# Patient Record
Sex: Male | Born: 1950
Health system: Southern US, Community
[De-identification: ages and names within clinical notes are randomized; demographics above are authoritative.]

## PROBLEM LIST (undated history)

## (undated) DIAGNOSIS — R399 Unspecified symptoms and signs involving the genitourinary system: Secondary | ICD-10-CM

## (undated) DIAGNOSIS — H269 Unspecified cataract: Secondary | ICD-10-CM

## (undated) DIAGNOSIS — G4730 Sleep apnea, unspecified: Secondary | ICD-10-CM

## (undated) DIAGNOSIS — Z860100 Personal history of colon polyps, unspecified: Secondary | ICD-10-CM

## (undated) DIAGNOSIS — Z9989 Dependence on other enabling machines and devices: Secondary | ICD-10-CM

## (undated) DIAGNOSIS — N529 Male erectile dysfunction, unspecified: Secondary | ICD-10-CM

## (undated) DIAGNOSIS — G250 Essential tremor: Secondary | ICD-10-CM

## (undated) DIAGNOSIS — M5030 Other cervical disc degeneration, unspecified cervical region: Secondary | ICD-10-CM

## (undated) DIAGNOSIS — G4733 Obstructive sleep apnea (adult) (pediatric): Secondary | ICD-10-CM

## (undated) DIAGNOSIS — E785 Hyperlipidemia, unspecified: Secondary | ICD-10-CM

## (undated) DIAGNOSIS — Z87442 Personal history of urinary calculi: Secondary | ICD-10-CM

## (undated) DIAGNOSIS — K219 Gastro-esophageal reflux disease without esophagitis: Secondary | ICD-10-CM

## (undated) DIAGNOSIS — N189 Chronic kidney disease, unspecified: Secondary | ICD-10-CM

## (undated) DIAGNOSIS — N400 Enlarged prostate without lower urinary tract symptoms: Secondary | ICD-10-CM

## (undated) DIAGNOSIS — E119 Type 2 diabetes mellitus without complications: Secondary | ICD-10-CM

## (undated) DIAGNOSIS — R7301 Impaired fasting glucose: Secondary | ICD-10-CM

## (undated) DIAGNOSIS — I10 Essential (primary) hypertension: Secondary | ICD-10-CM

## (undated) DIAGNOSIS — Z973 Presence of spectacles and contact lenses: Secondary | ICD-10-CM

## (undated) HISTORY — DX: Unspecified cataract: H26.9

## (undated) HISTORY — PX: POLYPECTOMY: SHX149

## (undated) HISTORY — DX: Essential (primary) hypertension: I10

## (undated) HISTORY — DX: Hyperlipidemia, unspecified: E78.5

## (undated) HISTORY — DX: Benign prostatic hyperplasia without lower urinary tract symptoms: N40.0

## (undated) HISTORY — PX: HAND SURGERY: SHX662

## (undated) HISTORY — DX: Male erectile dysfunction, unspecified: N52.9

## (undated) HISTORY — DX: Impaired fasting glucose: R73.01

## (undated) HISTORY — DX: Gastro-esophageal reflux disease without esophagitis: K21.9

## (undated) HISTORY — PX: COLONOSCOPY: SHX174

## (undated) HISTORY — DX: Sleep apnea, unspecified: G47.30

## (undated) HISTORY — DX: Chronic kidney disease, unspecified: N18.9

## (undated) HISTORY — DX: Type 2 diabetes mellitus without complications: E11.9

## (undated) HISTORY — PX: CARDIOVASCULAR STRESS TEST: SHX262

---

## 1998-03-16 HISTORY — PX: CERVICAL DISCECTOMY: SHX98

## 1999-03-17 HISTORY — PX: CERVICAL FUSION: SHX112

## 2001-11-01 ENCOUNTER — Encounter: Payer: Self-pay | Admitting: Neurological Surgery

## 2001-11-01 ENCOUNTER — Ambulatory Visit (HOSPITAL_COMMUNITY): Admission: RE | Admit: 2001-11-01 | Discharge: 2001-11-02 | Payer: Self-pay | Admitting: Neurological Surgery

## 2001-11-01 HISTORY — PX: ANTERIOR CERVICAL DECOMP/DISCECTOMY FUSION: SHX1161

## 2004-03-16 LAB — HM COLONOSCOPY

## 2005-04-26 ENCOUNTER — Encounter: Admission: RE | Admit: 2005-04-26 | Discharge: 2005-04-26 | Payer: Self-pay | Admitting: Family Medicine

## 2005-10-15 ENCOUNTER — Encounter: Admission: RE | Admit: 2005-10-15 | Discharge: 2005-10-15 | Payer: Self-pay | Admitting: Specialist

## 2006-02-03 ENCOUNTER — Encounter (INDEPENDENT_AMBULATORY_CARE_PROVIDER_SITE_OTHER): Payer: Self-pay | Admitting: Specialist

## 2006-02-03 ENCOUNTER — Ambulatory Visit (HOSPITAL_COMMUNITY): Admission: RE | Admit: 2006-02-03 | Discharge: 2006-02-03 | Payer: Self-pay | Admitting: *Deleted

## 2007-03-01 ENCOUNTER — Ambulatory Visit (HOSPITAL_COMMUNITY): Admission: RE | Admit: 2007-03-01 | Discharge: 2007-03-01 | Payer: Self-pay | Admitting: *Deleted

## 2008-10-17 ENCOUNTER — Encounter: Admission: RE | Admit: 2008-10-17 | Discharge: 2008-10-17 | Payer: Self-pay | Admitting: Internal Medicine

## 2010-03-16 HISTORY — PX: KNEE ARTHROSCOPY: SUR90

## 2010-07-29 NOTE — Op Note (Signed)
[name], Tyler Wiggins               ACCOUNT NO.:  [id number]   MEDICAL RECORD NO.:  [id number]          PATIENT TYPE:  AMB   LOCATION:  ENDO                         FACILITY:  [facility]   PHYSICIAN:  Georgiana Spinner, M.D.    DATE OF BIRTH:  [birth year]   DATE OF PROCEDURE:  03/01/2007  DATE OF DISCHARGE:                               OPERATIVE REPORT   PROCEDURE:  Upper endoscopy.   INDICATIONS:  Gastroesophageal reflux disease.   ANESTHESIA:  Fentanyl 50 mcg, Versed 5 mg.   PROCEDURE:  With the patient mildly sedated in the left lateral  decubitus position the Pentax videoscopic endoscope was inserted in the  mouth passed under direct vision through the esophagus which appeared  normal and after close inspection there appeared to be no evidence of  Barrett's esophagus.  We entered into the stomach.  The fundus, body,  antrum, duodenal bulb, second portion duodenum were visualized.  From  this point the endoscope was slowly withdrawn taking circumferential  views of duodenal mucosa until the endoscope had been pulled back into  stomach placed in retroflexion to view the stomach from below.  The  endoscope was then straightened and withdrawn taking circumferential  views remaining gastric and esophageal mucosa.  The patient's vital  signs, pulse oximeter remained stable.  The patient tolerated procedure  well without apparent complications.   FINDINGS:  Negative examination without any evidence of Barrett's  esophagus.   PLAN:  Proceed to colonoscopy.           ______________________________  Georgiana Spinner, M.D.     GMO/MEDQ  D:  03/01/2007  T:  03/01/2007  Job:  [id number]

## 2010-07-29 NOTE — Op Note (Signed)
NAMEUSBALDO, Tyler Wiggins               ACCOUNT NO.:  [id number]   MEDICAL RECORD NO.:  [id number]          PATIENT TYPE:  AMB   LOCATION:  ENDO                         FACILITY:  [facility]   PHYSICIAN:  Georgiana Spinner, M.D.    DATE OF BIRTH:  16-Oct-1950   DATE OF PROCEDURE:  03/01/2007  DATE OF DISCHARGE:                               OPERATIVE REPORT   PROCEDURE:  Colonoscopy.   INDICATIONS:  Colon polyp with large polyp removed and eradicated last  year.   ANESTHESIA:  Fentanyl 70 mcg; Versed 7 mg.   PROCEDURE:  With the patient mildly sedated in the left lateral  decubitus position, a rectal examination was attempted.  Subsequently  the Pentax videoscopic colonoscope was inserted in the rectum and passed  under direct vision with pressure applied to reach the cecum, identified  by ileocecal valve and appendiceal orifice, both of which were  photographed.  From this point the colonoscope was slowly withdrawn,  taking circumferential views of the colonic mucosa, carefully inspecting  as we went, and pulling back all the way to the rectum which appeared  normal on direct and showed hemorrhoids on retroflexed view.  The  endoscope was straightened and withdrawn.  The patient's vital signs and  pulse oximeter remained stable.  The patient tolerated the procedure  well without apparent complications.   FINDINGS:  Internal hemorrhoids, otherwise unremarkable examination.   PLAN:  Repeat examination in 5 years.           ______________________________  Georgiana Spinner, M.D.     GMO/MEDQ  D:  03/01/2007  T:  03/01/2007  Job:  [id number]

## 2010-08-01 NOTE — Op Note (Signed)
NAME:  Tyler Wiggins, Tyler Wiggins                         ACCOUNT NO.:  [id number]   MEDICAL RECORD NO.:  [id number]                   PATIENT TYPE:  OIB   LOCATION:  3172                                 FACILITY:  [facility]   PHYSICIAN:  Stefani Dama, M.D.               DATE OF BIRTH:  1950/05/20   DATE OF PROCEDURE:  11/01/2001  DATE OF DISCHARGE:                                 OPERATIVE REPORT   PREOPERATIVE DIAGNOSIS:  C5-6 herniated nucleus pulposus with cervical  radiculopathy.   POSTOPERATIVE DIAGNOSIS:  C5-6 herniated nucleus pulposus with cervical  radiculopathy.   PROCEDURE:  Anterior cervical diskectomy and arthrodesis with structural  allograft, Synthes plate fixation, C5-6.   SURGEON:  Stefani Dama, M.D.   ASSISTANT:  Hewitt Shorts, M.D.   ANESTHESIA:  General endotracheal.   INDICATIONS:  The patient is a 60 year old individual who has had  significant neck, shoulder, and arm pain, particularly in the left arm.  He  has failed conservative efforts at management and has a broad-based disk  herniation at the C5-6 level with biforaminal stenosis.  He has previously  had an anterior diskectomy at C6-7 some 10 years ago, which has not caused  him any difficulties.   DESCRIPTION OF PROCEDURE:  The patient was brought to the operating room and  placed on the table in supine position.  After the smooth induction of  general endotracheal anesthesia, he was placed in five pounds of Holter  traction.  The neck was shaved, prepped with Duraprep, and draped in a  sterile fashion.  A transverse incision on the left side of the neck was  brought down through the platysma, and the plane between the  sternocleidomastoid and strap muscles was dissected bluntly until the  prevertebral space was reached.  The first identifiable disk space was noted  to be that of C5-6, and then after clearing the longus colli muscle on  either side of the midline, a Caspar retractor was placed in  the wound.  Diskectomy was performed by opening the ventral aspect of the disk space  with a 15 blade, and then a combination of curettes and rongeurs was used to  evacuate the disk space.  A high-speed bur and a 2.3 mm dissecting tool were  used to take down the osteophytes and the uncinate spurs that were forming  in either side.  There was found to be subligamentous disk material  herniated in the foramen on each side at the level of the uncinate  processes.  Each side was decompressed and ultimately the posterior  longitudinal ligament was opened completely to expose the common dural tube  and the takeoff of the C6 nerve roots.  Once this was accomplished,  hemostasis in the soft tissues was obtained, and an 8 mm tricortical graft  was placed into the interspace with the tricortical surface facing dorsally  and the ventral aspect superior.  A 19 mm standard-size Synthes plate was  affixed to the ventral aspects of the vertebral bodies with four locking 4 x  14 mm screws.  Hemostasis was again checked, and the construct was checked  radiographically.  The platysma was then closed with 3-0 Vicryl in  interrupted fashion, and 3-0 Vicryl was used to close the subcuticular  tissues.  The patient tolerated the procedure well and was returned to the  recovery room in stable condition.                                               Stefani Dama, M.D.   Merla Riches  D:  11/01/2001  T:  11/03/2001  Job:  [id number]

## 2010-08-01 NOTE — Op Note (Signed)
Tyler Wiggins, Tyler Wiggins               ACCOUNT NO.:  [id number]   MEDICAL RECORD NO.:  [id number]          PATIENT TYPE:  AMB   LOCATION:  ENDO                         FACILITY:  [facility]   PHYSICIAN:  Georgiana Spinner, M.D.    DATE OF BIRTH:  May 01, 1950   DATE OF PROCEDURE:  02/03/2006  DATE OF DISCHARGE:                                 OPERATIVE REPORT   PROCEDURE:  Colonoscopy.   INDICATIONS:  Colon polyp.   ANESTHESIA:  Fentanyl 25 mcg, Versed 2 mg.   PROCEDURE:  With the patient mildly sedated in the left lateral decubitus  position, a rectal exam was performed which was unremarkable.  Subsequently  the Olympus videoscopic colonoscope was inserted in the rectum and passed  under direct vision to the cecum, identified by ileocecal valve and  appendiceal orifice.  From this point the colonoscope was slowly withdrawn  taking circumferential views of the colonic mucosa, stopping only in the  ascending colon just a few folds removed from the ileocecal valve, at which  point we encountered a polyp that we photographed first.  Using hot biopsy  forceps technique, we removed a portion of the polyp.  We then next used  snare cautery technique and removed another portion of the polyp, again with  a setting of 20/200 blended current.  it was clear that there was more polyp  behind the folds, so I placed the colonoscope into a retroflexed view from  the cecum and was able to view the polyp in its entirety on the backside of  the fold and therefore, using snare cautery technique and once again a  setting of 20/200 blended current, I was able to remove the entire polyp.  It was three-headed, this was photographed and, as stated, subsequently  removed.  However, when we did this the polyp jumped off the snare and we  could not retrieve it although we looked rather exhaustively throughout the  ascending colon and cecum both in direct and retroflexed view, and then I  elected to withdraw the colonoscope  from that point, taking circumferential  views of the remaining colonic mucosa, stopping then only in the rectum,  which appeared normal on direct and showed hemorrhoids on retroflexed view,  the endoscope was straightened and withdrawn.  The patient's vital signs and  pulse oximeter remained stable.  The patient tolerated the procedure well  without apparent complications.   FINDINGS:  1. Internal hemorrhoids.  2. Polyp of the ascending colon, removed.  Await biopsy report.  We have      two portions of the polyp, but a majority of it was lost.   PLAN:  Await biopsy report.  The patient will call me for results and follow  up with me as an outpatient.           ______________________________  Georgiana Spinner, M.D.     GMO/MEDQ  D:  02/03/2006  T:  02/03/2006  Job:  [id number]

## 2010-08-01 NOTE — Op Note (Signed)
Tyler Wiggins, Tyler Wiggins NO.:  [id number]   MEDICAL RECORD NO.:  [id number]          PATIENT TYPE:  AMB   LOCATION:  ENDO                         FACILITY:  [facility]   PHYSICIAN:  Georgiana Spinner, M.D.    DATE OF BIRTH:  08-28-1950   DATE OF PROCEDURE:  02/03/2006  DATE OF DISCHARGE:                                 OPERATIVE REPORT   SURGEON:  Georgiana Spinner, M.D.   PROCEDURE:  Upper endoscopy.   INDICATIONS:  GERD.   ANESTHESIA:  Fentanyl 50 mcg, Versed 5 mg.   PROCEDURE:  With the patient mildly sedated in the left lateral decubitus  position, the Olympus videoscopic endoscope was inserted in the mouth and  passed under direct vision through the esophagus, which appeared normal,  into the stomach.  The fundus, body, antrum, duodenal bulb and second  portion of the duodenum appeared normal.  From this point, the endoscope was  slowly withdrawn, taking circumferential views of the duodenal mucosa, until  the endoscope was then pulled back into the stomach and placed in  retroflexion to view the stomach from below.  The endoscope was then  straightened and withdrawn, taking circumferential views of the remaining  gastric and esophageal mucosa.  The patient's vital signs and pulse oximetry  remained stable.  The patient tolerated the procedure well without apparent  complications.   FINDINGS:  Rather unremarkable examination.   PLAN:  Proceed to colonoscopy.           ______________________________  Georgiana Spinner, M.D.     GMO/MEDQ  D:  02/03/2006  T:  02/04/2006  Job:  [id number]

## 2010-08-26 ENCOUNTER — Telehealth: Payer: Self-pay | Admitting: Internal Medicine

## 2010-08-26 NOTE — Telephone Encounter (Addendum)
ROI faxed over to [facility] @ [phone] to Obtain records from Dr.Tysinger  08/26/10/km  Records received from [facility] they were faxed to Schedulers fax,verified with  [facility] records received  09/09/10/km

## 2010-09-12 ENCOUNTER — Encounter: Payer: Self-pay | Admitting: Internal Medicine

## 2010-09-15 ENCOUNTER — Ambulatory Visit (INDEPENDENT_AMBULATORY_CARE_PROVIDER_SITE_OTHER): Payer: 59 | Admitting: Internal Medicine

## 2010-09-15 ENCOUNTER — Encounter: Payer: Self-pay | Admitting: Internal Medicine

## 2010-09-15 ENCOUNTER — Telehealth: Payer: Self-pay | Admitting: *Deleted

## 2010-09-15 VITALS — BP 130/84 | HR 88 | Ht 72.0 in | Wt 272.0 lb

## 2010-09-15 DIAGNOSIS — E669 Obesity, unspecified: Secondary | ICD-10-CM | POA: Insufficient documentation

## 2010-09-15 DIAGNOSIS — E785 Hyperlipidemia, unspecified: Secondary | ICD-10-CM

## 2010-09-15 DIAGNOSIS — I10 Essential (primary) hypertension: Secondary | ICD-10-CM | POA: Insufficient documentation

## 2010-09-15 DIAGNOSIS — R9431 Abnormal electrocardiogram [ECG] [EKG]: Secondary | ICD-10-CM | POA: Insufficient documentation

## 2010-09-15 NOTE — Assessment & Plan Note (Signed)
Patient's EKG is without significant abnormaliites. Current findings are nonspecific. Different from previous probably because he is 70# heavier. I think he is at low risk for a major cardiac event   I have placed no work/activity restrictions.  He is free to exercise vigorously.

## 2010-09-15 NOTE — Assessment & Plan Note (Signed)
Will try to get last fasting lipids.

## 2010-09-15 NOTE — Progress Notes (Signed)
HPI  Patient is a 60 year old with no known CAD.  He presents as a prejob physical The patient was seen by Richarda Blade in the past.  He had an EKG done that was read out as abnormal.   He underwent a stress myoview in December 2010.  He exdercised for 11 minutes.  EKIG was without ischemia   Myoview showed normal perfuison. Since then he denies chest pains.  He is very active.  Works in the yard.  Mows the lawn.  Plays raquetball.  No problems. He is trying to lose wt.   No Known Allergies  Current Outpatient Prescriptions  Medication Sig Dispense Refill  . aspirin 325 MG tablet Take 325 mg by mouth daily.        . CYCLOBENZAPRINE HCL PO Take by mouth.        Marland Kitchen lisinopril-hydrochlorothiazide (PRINZIDE,ZESTORETIC) 10-12.5 MG per tablet Take 1 tablet by mouth daily.        . rosuvastatin (CRESTOR) 10 MG tablet Take 10 mg by mouth daily.        . Tamsulosin HCl (FLOMAX) 0.4 MG CAPS Take by mouth.        . DISCONTD: LISINOPRIL PO Take by mouth.        . DISCONTD: Rosuvastatin Calcium (CRESTOR PO) Take by mouth.         Past Medical History  Diagnosis Date  . HTN (hypertension)     history of  . HLD (hyperlipidemia)     history of    No past surgical history on file.  Family History  Problem Relation Age of Onset  . Heart defect Father     cabg    History   Social History  . Marital Status: Married    Spouse Name: N/A    Number of Children: N/A  . Years of Education: N/A   Occupational History  . Not on file.   Social History Main Topics  . Smoking status: Former Smoker    Quit date: 03/16/1993  . Smokeless tobacco: Not on file  . Alcohol Use: Not on file  . Drug Use: Not on file  . Sexually Active: Not on file   Other Topics Concern  . Not on file   Social History Narrative  . No narrative on file    Review of Systems:  All systems reviewed.  They are negative to the above problem except as previously stated.  Vital Signs: BP 130/84  Pulse 88  Ht 6'  (1.829 m)  Wt 272 lb (123.378 kg)  BMI 36.89 kg/m2  Physical Exam  Patient is in NAD.  HEENT:  Normocephalic, atraumatic. EOMI, PERRLA.  Neck: JVP is normal. No thyromegaly. No bruits.  Lungs: clear to auscultation. No rales no wheezes.  Heart: Regular rate and rhythm. Normal S1, S2. No S3.   No significant murmurs. PMI not displaced.  Abdomen:  Supple, nontender. Normal bowel sounds. No masses. No hepatomegaly.  Extremities:   Good distal pulses throughout. No lower extremity edema.  Musculoskeletal :moving all extremities.  Neuro:   alert and oriented x3.  CN II-XII grossly intact.  EKG:  NSR.  88 bpm.  Q  Wave in III.  (since previous EKGs QRS voltages are less prominent  (patient is 70# heavier))  Assessment and Plan:

## 2010-09-15 NOTE — Telephone Encounter (Signed)
LMOM to find out where he wants app. Info and EKG faxed to.

## 2010-09-15 NOTE — Assessment & Plan Note (Signed)
Adquate control.

## 2010-09-15 NOTE — Assessment & Plan Note (Signed)
Encouraged him to try [employer].

## 2010-09-16 NOTE — Telephone Encounter (Signed)
Called patient back. He has no fax # to send information to, so he will pick up a copy of the office note and the EKG from yesterday. Copies left at the front desk.

## 2010-09-16 NOTE — Telephone Encounter (Signed)
Returning call back to [place].

## 2010-09-18 ENCOUNTER — Encounter: Payer: Self-pay | Admitting: Internal Medicine

## 2010-09-26 ENCOUNTER — Telehealth: Payer: Self-pay | Admitting: Internal Medicine

## 2010-09-26 NOTE — Telephone Encounter (Signed)
LOV,12 lead faxed to [facility] @ [phone]  09/26/10/km

## 2011-04-23 ENCOUNTER — Other Ambulatory Visit: Payer: Self-pay | Admitting: Family Medicine

## 2011-04-23 LAB — COMPREHENSIVE METABOLIC PANEL
ALT: 35 U/L (ref 0–53)
AST: 26 U/L (ref 0–37)
Albumin: 4.5 g/dL (ref 3.5–5.2)
Alkaline Phosphatase: 40 U/L (ref 39–117)
BUN: 30 mg/dL — ABNORMAL HIGH (ref 6–23)
Potassium: 4.8 mEq/L (ref 3.5–5.3)
Sodium: 134 mEq/L — ABNORMAL LOW (ref 135–145)
Total Protein: 7.2 g/dL (ref 6.0–8.3)

## 2011-04-23 LAB — LIPID PANEL
HDL: 27 mg/dL — ABNORMAL LOW (ref >39)
LDL Cholesterol: 52 mg/dL (ref 0–99)

## 2011-04-28 NOTE — Progress Notes (Signed)
Quick Note:  You labs are abnormal. Please call pt And notify him that his labs indicate that he is probably a little dehydrated and his kidney function numbers confirm this. Encourage adequate water intake To help his kidney function; of course, this is also a result of long-standing HTN. His lipid numbers are abnormal; he should continue his current meds, improve his nutrition. Provide him with a copy   of his labs And advise that we recheck them in about 3 months.  Thank you.  ______

## 2011-04-29 ENCOUNTER — Encounter: Payer: Self-pay | Admitting: Family Medicine

## 2011-09-11 ENCOUNTER — Ambulatory Visit (INDEPENDENT_AMBULATORY_CARE_PROVIDER_SITE_OTHER): Payer: 59 | Admitting: Family Medicine

## 2011-09-11 ENCOUNTER — Ambulatory Visit: Payer: 59

## 2011-09-11 VITALS — BP 144/86 | HR 56 | Temp 98.6°F | Resp 14 | Ht 71.0 in | Wt 274.0 lb

## 2011-09-11 DIAGNOSIS — M79673 Pain in unspecified foot: Secondary | ICD-10-CM

## 2011-09-11 DIAGNOSIS — H6500 Acute serous otitis media, unspecified ear: Secondary | ICD-10-CM

## 2011-09-11 DIAGNOSIS — H6990 Unspecified Eustachian tube disorder, unspecified ear: Secondary | ICD-10-CM

## 2011-09-11 DIAGNOSIS — M79609 Pain in unspecified limb: Secondary | ICD-10-CM

## 2011-09-11 DIAGNOSIS — H6980 Other specified disorders of Eustachian tube, unspecified ear: Secondary | ICD-10-CM

## 2011-09-11 DIAGNOSIS — H9190 Unspecified hearing loss, unspecified ear: Secondary | ICD-10-CM

## 2011-09-11 MED ORDER — MELOXICAM 15 MG PO TABS: 15.0000 mg | ORAL_TABLET | Freq: Every day | ORAL | Status: DC

## 2011-09-11 MED ORDER — IPRATROPIUM BROMIDE 0.03 % NA SOLN: 2.0000 | Freq: Two times a day (BID) | NASAL | Status: DC

## 2011-09-11 NOTE — Progress Notes (Signed)
  Subjective:    Patient ID: Tyler Wiggins, male    DOB: Jul 27, 1950, 61 y.o.   MRN: [id number]  HPI  Presents with several weeks of foot pain.  Began after started wearing Dr. Margart Sickles inserts (a type that you make following kit instructions).  Worse on the right.  When he discontinued them, the pain on the left nearly completely resolved, but persists on the right.  Has noticed some swelling in the area of discomfort, along the arch and medially.  Additionally, he notes several days of sudden hearing loss in the right ear.  This is different from the chronic loss of hearing higher frequencies that he's had for some time.  No pain or drainage.  Intermittent nasal congestion, runny nose and post-nasal drainage.  No recent respiratory illness.  Review of Systems As above.    Objective:   Physical Exam Vital signs noted. Well-developed, well nourished WM who is awake, alert and oriented, in NAD. HEENT: [place]/AT, sclera and conjunctiva are clear.  EAC are patent, left TM is normal in appearance. The right TM is mildly injected and bulging, but translucent. Nasal mucosa is pink and moist. OP is clear. Neck: supple, non-tender, no lymphadenopathy, thyromegaly. Heart: RRR, no murmur Lungs: CTA Extremities: no cyanosis, clubbing.  Minimal swelling noted of the right foot medially along th arch.  Modestly tender, and mild pain with ROM. Skin: warm and dry without rash.  [facility] reading (PRIMARY) by  Dr. Milus Glazier.  Normal foot.     Assessment & Plan:   1. Foot pain  DG Foot Complete Right, meloxicam (MOBIC) 15 MG tablet  2. Hearing loss, acute on chronic    3. ETD (eustachian tube dysfunction)  ipratropium (ATROVENT) 0.03 % nasal spray  4. Otitis media, acute serous  ipratropium (ATROVENT) 0.03 % nasal spray   RTC if symptoms worsen or persist. Discussed with Dr. Milus Glazier.

## 2011-11-06 ENCOUNTER — Ambulatory Visit: Payer: 59

## 2011-11-06 ENCOUNTER — Ambulatory Visit (INDEPENDENT_AMBULATORY_CARE_PROVIDER_SITE_OTHER): Payer: 59 | Admitting: Emergency Medicine

## 2011-11-06 VITALS — BP 108/64 | HR 62 | Temp 98.5°F | Resp 18 | Ht 71.0 in | Wt 257.0 lb

## 2011-11-06 DIAGNOSIS — IMO0002 Reserved for concepts with insufficient information to code with codable children: Secondary | ICD-10-CM

## 2011-11-06 DIAGNOSIS — M25476 Effusion, unspecified foot: Secondary | ICD-10-CM

## 2011-11-06 DIAGNOSIS — M109 Gout, unspecified: Secondary | ICD-10-CM

## 2011-11-06 MED ORDER — INDOMETHACIN 50 MG PO CAPS: 50.0000 mg | ORAL_CAPSULE | Freq: Two times a day (BID) | ORAL | Status: DC

## 2011-11-06 MED ORDER — COLCHICINE 0.6 MG PO TABS: 0.6000 mg | ORAL_TABLET | Freq: Every day | ORAL | Status: DC

## 2011-11-06 MED ORDER — INDOMETHACIN 50 MG PO CAPS: 50.0000 mg | ORAL_CAPSULE | Freq: Two times a day (BID) | ORAL | Status: AC

## 2011-11-06 NOTE — Progress Notes (Signed)
Date:  11/06/2011   Name:  Tyler Wiggins   DOB:  Jul 19, 1950   MRN:  [id number] Gender: male  Age: 61 y.o.  PCP:  Birdena Jubilee, MD    Chief Complaint: Joint Swelling   History of Present Illness:  Tyler Wiggins is a 61 y.o. pleasant patient who presents with the following:  Has been walking on a treadmill attempting to lose weight and has developed pain in lateral ankle and foot that is progressive and worsening over the past three days. He awakened in the morning and had pain.  No recollection of injury.  No history of gout.  Prior effusion of knee and was aspirated with no diagnosis per patient.  Patient Active Problem List  Diagnosis  . Abnormal EKG  . Dyslipidemia  . Hypertension  . Obesity    Past Medical History  Diagnosis Date  . HTN (hypertension)     history of  . HLD (hyperlipidemia)     history of    Past Surgical History  Procedure Date  . Spine surgery     c-spine fusion  . Hand surgery     History  Substance Use Topics  . Smoking status: Former Smoker    Quit date: 03/16/1993  . Smokeless tobacco: Not on file  . Alcohol Use: Not on file    Family History  Problem Relation Age of Onset  . Heart defect Father     cabg    Allergies  Allergen Reactions  . Valium (Diazepam)     disorientation    Medication list has been reviewed and updated.  Current Outpatient Prescriptions on File Prior to Visit  Medication Sig Dispense Refill  . aspirin 325 MG tablet Take 325 mg by mouth daily.        . CYCLOBENZAPRINE HCL PO Take 10 mg by mouth at bedtime.       Marland Kitchen lisinopril-hydrochlorothiazide (PRINZIDE,ZESTORETIC) 10-12.5 MG per tablet Take 1 tablet by mouth daily.        . rosuvastatin (CRESTOR) 10 MG tablet Take 10 mg by mouth daily.        . Tamsulosin HCl (FLOMAX) 0.4 MG CAPS Take 0.8 mg by mouth.         Review of Systems:  As per HPI, otherwise negative.    Physical Examination: Filed Vitals:   11/06/11 1634  BP: 108/64  Pulse: 62    Temp: 98.5 F (36.9 C)  Resp: 18   Filed Vitals:   11/06/11 1634  Height: 5\' 11"  (1.803 m)  Weight: 257 lb (116.574 kg)   Body mass index is 35.84 kg/(m^2). Ideal Body Weight: Weight in (lb) to have BMI = 25: 178.9    GEN: WDWN, NAD, Non-toxic, Alert & Oriented x 3 HEENT: Atraumatic, Normocephalic.  Ears and Nose: No external deformity. EXTR: No clubbing/cyanosis/edema NEURO: Normal gait.  PSYCH: Normally interactive. Conversant. Not depressed or anxious appearing.  Calm demeanor.  Right ankle effusion and tenderness lateral ankle and over base of 5th MC.  No deformity, crepitus, or limitation of motion.  No ecchymosis.  NATI  Assessment and Plan: [facility] reading (PRIMARY) by  Dr. Dareen Piano.  Negative  Gout Indocin colcrys Follow up as needed.    Carmelina Dane, MD

## 2011-11-06 NOTE — Addendum Note (Signed)
Addended by: Carmelina Dane on: 11/06/2011 06:09 PM   Modules accepted: Orders

## 2012-01-25 ENCOUNTER — Ambulatory Visit (INDEPENDENT_AMBULATORY_CARE_PROVIDER_SITE_OTHER): Payer: 59 | Admitting: Neurology

## 2012-01-25 ENCOUNTER — Encounter: Payer: Self-pay | Admitting: Neurology

## 2012-01-25 VITALS — BP 100/68 | HR 64 | Temp 97.8°F | Resp 16 | Ht 71.5 in | Wt 267.0 lb

## 2012-01-25 DIAGNOSIS — I10 Essential (primary) hypertension: Secondary | ICD-10-CM

## 2012-01-25 DIAGNOSIS — G250 Essential tremor: Secondary | ICD-10-CM
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
## 2012-01-25 NOTE — Patient Instructions (Signed)
1.  Call me if you need a follow up appointment or if your tremor is not well controlled

## 2012-07-12 ENCOUNTER — Telehealth: Payer: Self-pay | Admitting: *Deleted

## 2012-07-12 MED ORDER — CYCLOBENZAPRINE HCL 10 MG PO TABS: 10.0000 mg | ORAL_TABLET | ORAL | Status: DC

## 2012-07-12 NOTE — Telephone Encounter (Signed)
PT CALLED AND SAID THAT HE NEEDS A REFILL ON HIS FLEXERIL IT HELPS WITH HIS HEADACHES.  PT WANTS RX CALLED INTO [facility] ON [address].

## 2012-07-12 NOTE — Telephone Encounter (Signed)
I sent 90 day supply to [facility].  Please contact him and remind him he is due for labs and renew all his refills sometime in June (middle) Schedule both appts if you can. Thanks PG

## 2012-07-26 ENCOUNTER — Encounter: Payer: Self-pay | Admitting: Family Medicine

## 2012-07-26 ENCOUNTER — Ambulatory Visit (INDEPENDENT_AMBULATORY_CARE_PROVIDER_SITE_OTHER): Payer: 59 | Admitting: Family Medicine

## 2012-07-26 VITALS — BP 121/83 | HR 72 | Wt 259.0 lb

## 2012-07-26 DIAGNOSIS — M79609 Pain in unspecified limb: Secondary | ICD-10-CM

## 2012-07-26 NOTE — Patient Instructions (Addendum)
1)  Right Inner Thigh Pain - I think you may have pulled a muscle vs have phlebitis (inflammation of vein).  Take Ibuprofen 800 mg plus Tylenol 1000 mg twice a day and apply heat (heating pad, soak hot tub, Thermacare) and elevated.  If you don't improve or if you worsen then we'll consider getting a doppler study to make sure that you don't have a blood clot.    2)  GERD - Since you are going to take an anti-inflammatory medication, to protect your stomach, I want you to take an extra PPI daily (Nexium) for a couple of weeks.       Phlebitis Phlebitis is a redness, tenderness and soreness (inflammation) in a vein. This can occur in your arms, legs, or torso (trunk), as well as deeper inside your body.  CAUSES  Phlebitis can be triggered by multiple factors. These include:  Reduced (restricted) blood flow through your veins. This happens with prolonged bed rest, long distance travel, injury or surgery. Being overweight (obese) and pregnant can also restrict blood flow and lead to phlebitis.  Putting a catheter in the vein (intravenous or IV) and giving certain medications through in the vein (intravenously).  Cancer and cancer treatment.  Use of illegal intravenous drugs.  Inflammatory diseases.  Inherited (genetic) diseases that increase the risk for blood clots.  Hormone therapy (such as birth control pills). SYMPTOMS   Red, tender, swollen, painful area on your skin.  Usually, the area will be long and narrow.  Low grade fever.  Significant firmness along the center of this area. This can indicate that a blood clot has formed.  Surrounding redness or a high fever, which can indicate an infection (cellulitis). DIAGNOSIS   The appearance of your condition and your symptoms will cause your caregiver to suspect phlebitis. Usually, this is enough for a diagnosis.  Your caregiver may request blood tests or an ultrasound test of the area to be sure you do not have an infection or a  blood clot. Blood tests and discussing your family history may also indicate if you have an underlying genetic disease that causes blood clots.  Occasionally, a piece of tissue is taken from the body (biopsy) if an unusual cause of phlebitis is suspected. TREATMENT   Raise (elevate) the affected area above the level of the heart.  Apply a warm compress or heating pad for 20 minutes, 3 or 4 times a day. If you use an electric heating pad, follow the directions so you do not burn yourself.  Anti-inflammatory medications are usually recommended. Follow your caregiver's directions.  Any IV catheter, if present, will be removed by your caregiver.  Your caregiver may prescribe medicines that kill germs (antibiotics) if an infection is present.  Your caregiver may recommend blood thinners if a blood clot is suspected or present.  Support stockings or bandages may be helpful, depending on the cause and location of the phlebitis.  Surgery may be needed to remove very damaged sections of vein, but this is rare. HOME CARE INSTRUCTIONS   Take medications exactly as prescribed.  Follow up with your caregiver as directed.  Use support stockings or bandages if advised. These will speed healing and prevent recurrence.  If you are on blood thinners:  Do follow-up blood tests exactly as directed.  Check with your caregiver before using any new medications.  Wear a pendant to show that you are on blood thinners.  For phlebitis in the legs:  Avoid prolonged standing or  bed rest.  Keep your legs moving. Raise your legs with sitting or lying.  Do not smoke.  Women, particularly those over the age of 56, should consider the risks and benefits of taking the contraceptive pill. This kind of hormone treatment can increase your risk for blood clots. SEEK MEDICAL CARE IF:   You have unusual bruising or any bleeding problems.  Swelling or pain in your affected arm or leg is not gradually  improving.  You are on anti-inflammatory medication and you develop belly (abdominal) pain. SEEK IMMEDIATE MEDICAL CARE IF:   An unexplained oral temperature above 100.5 F (38.1 C) develops.  You have sudden onset of chest pain or difficulty breathing. Document Released: 02/24/2001 Document Revised: 05/25/2011 Document Reviewed: 11/26/2008 [facility] Patient Information 2013 [employer], [place].

## 2012-07-26 NOTE — Progress Notes (Signed)
  Subjective:    Patient ID: Tyler Wiggins, male    DOB: 1950/07/12, 62 y.o.   MRN: [id number]  HPI  Salah is here today to have his right inner thigh evaluated.  He noticed a knot on his thigh about two weeks ago.  He feels that it has increased in size and has become tender to touch.     Review of Systems  Musculoskeletal:       He has a painful area on his right inner thigh.      Past Medical History  Diagnosis Date  . HTN (hypertension)     history of  . HLD (hyperlipidemia)     history of  . Tremor   . BPH (benign prostatic hyperplasia)   . Sleep apnea   . GERD (gastroesophageal reflux disease)   . Colon polyps   . Erectile dysfunction   . IFG (impaired fasting glucose)    Family History  Problem Relation Age of Onset  . Heart defect Father     cabg  . Heart disease Father   . Diabetes Mother    History   Social History Narrative   Marital Status:  Married Ship broker)    Children:  Son Thayer Ohm) Daughter Cala Bradford)    Pets:  Boxer/Lab/Shepard    Living Situation: Lives with wife and son.    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     Alcohol Use:  None    Drug Use:  None   Diet:  Regular   Exercise:  Racquetball    Hobbies:  Fishing       Objective:   Physical Exam  Musculoskeletal: He exhibits tenderness.  No swelling or knot was palpated.        Assessment & Plan:

## 2012-08-08 ENCOUNTER — Encounter: Payer: Self-pay | Admitting: Family Medicine

## 2012-08-08 DIAGNOSIS — M79609 Pain in unspecified limb: Secondary | ICD-10-CM | POA: Insufficient documentation

## 2012-08-08 NOTE — Assessment & Plan Note (Signed)
I did not feel anything unusual in Tyler Wiggins's thigh today.  He is to apply some Voltaren Gel.  If this pain continues, we'll send him for an U/S.

## 2012-08-13 ENCOUNTER — Other Ambulatory Visit: Payer: Self-pay | Admitting: Family Medicine

## 2012-08-18 ENCOUNTER — Other Ambulatory Visit: Payer: Self-pay | Admitting: Family Medicine

## 2012-08-18 NOTE — Telephone Encounter (Signed)
I just refilled his BP medication.  Please contact him and schedule him for labs and a week later to see Dr Alberteen Sam. Thanks PG

## 2012-08-30 ENCOUNTER — Ambulatory Visit (INDEPENDENT_AMBULATORY_CARE_PROVIDER_SITE_OTHER): Payer: 59 | Admitting: Family Medicine

## 2012-08-30 ENCOUNTER — Encounter: Payer: Self-pay | Admitting: Family Medicine

## 2012-08-30 VITALS — BP 111/76 | HR 70 | Wt 257.0 lb

## 2012-08-30 DIAGNOSIS — G8929 Other chronic pain: Secondary | ICD-10-CM

## 2012-08-30 DIAGNOSIS — R7301 Impaired fasting glucose: Secondary | ICD-10-CM

## 2012-08-30 DIAGNOSIS — I10 Essential (primary) hypertension: Secondary | ICD-10-CM

## 2012-08-30 DIAGNOSIS — E785 Hyperlipidemia, unspecified: Secondary | ICD-10-CM

## 2012-08-30 DIAGNOSIS — N400 Enlarged prostate without lower urinary tract symptoms: Secondary | ICD-10-CM

## 2012-08-30 DIAGNOSIS — K219 Gastro-esophageal reflux disease without esophagitis: Secondary | ICD-10-CM

## 2012-08-30 DIAGNOSIS — G250 Essential tremor: Secondary | ICD-10-CM

## 2012-08-30 DIAGNOSIS — Z1211 Encounter for screening for malignant neoplasm of colon: Secondary | ICD-10-CM

## 2012-08-30 DIAGNOSIS — G252 Other specified forms of tremor: Secondary | ICD-10-CM

## 2012-08-30 DIAGNOSIS — R404 Transient alteration of awareness: Secondary | ICD-10-CM

## 2012-08-30 DIAGNOSIS — R400 Somnolence: Secondary | ICD-10-CM

## 2012-08-30 DIAGNOSIS — M25569 Pain in unspecified knee: Secondary | ICD-10-CM

## 2012-08-30 DIAGNOSIS — G4730 Sleep apnea, unspecified: Secondary | ICD-10-CM

## 2012-08-30 MED ORDER — CYCLOBENZAPRINE HCL 10 MG PO TABS: 10.0000 mg | ORAL_TABLET | Freq: Every day | ORAL | Status: DC

## 2012-08-30 MED ORDER — SAXAGLIPTIN-METFORMIN ER 2.5-1000 MG PO TB24: 1.0000 | ORAL_TABLET | Freq: Two times a day (BID) | ORAL | Status: DC

## 2012-08-30 MED ORDER — SITAGLIP PHOS-METFORMIN HCL ER 50-1000 MG PO TB24: 1.0000 | ORAL_TABLET | Freq: Two times a day (BID) | ORAL | Status: DC

## 2012-08-30 MED ORDER — DICLOFENAC SODIUM 1 % TD GEL: 4.0000 g | Freq: Four times a day (QID) | TRANSDERMAL | Status: DC

## 2012-08-30 MED ORDER — FENOFIBRIC ACID 135 MG PO CPDR: 1.0000 | DELAYED_RELEASE_CAPSULE | Freq: Every day | ORAL | Status: DC

## 2012-08-30 MED ORDER — IRBESARTAN-HYDROCHLOROTHIAZIDE 150-12.5 MG PO TABS: 1.0000 | ORAL_TABLET | Freq: Every day | ORAL | Status: DC

## 2012-08-30 MED ORDER — CANAGLIFLOZIN 300 MG PO TABS: 300.0000 | ORAL_TABLET | Freq: Every day | ORAL | Status: DC

## 2012-08-30 MED ORDER — MODAFINIL 200 MG PO TABS: 200.0000 mg | ORAL_TABLET | ORAL | Status: DC

## 2012-08-30 MED ORDER — TAMSULOSIN HCL 0.4 MG PO CAPS: 0.8000 mg | ORAL_CAPSULE | Freq: Every day | ORAL | Status: AC

## 2012-08-30 MED ORDER — PROPRANOLOL HCL 20 MG PO TABS: 20.0000 mg | ORAL_TABLET | Freq: Two times a day (BID) | ORAL | Status: DC

## 2012-08-30 MED ORDER — OMEPRAZOLE 20 MG PO CPDR: 20.0000 mg | DELAYED_RELEASE_CAPSULE | Freq: Two times a day (BID) | ORAL | Status: DC

## 2012-08-30 MED ORDER — ROSUVASTATIN CALCIUM 10 MG PO TABS: 20.0000 mg | ORAL_TABLET | Freq: Every day | ORAL | Status: DC

## 2012-08-30 NOTE — Progress Notes (Signed)
  Subjective:    Patient ID: Tyler Wiggins, male    DOB: Dec 28, 1950, 62 y.o.   MRN: [id number]  HPI  Tyler Wiggins is here to discuss the conditions listed below and to have several of his medications refilled.  His only real "complaints" today are that he continues to feel tired and he has been feeling "lightheaded" recently.    1)  IFG:  He is doing well on the combination of Invokana and Janumet .    2)  Hyperlipidemia:  He is taking his fenofibrate and Crestor without any complaints    3)  Hypertension:  His BP is well controlled on propranolol and Avalide.    4)  GERD:  His symptoms are controlled with Prilosec.      Review of Systems  Constitutional: Positive for fatigue. Negative for activity change, appetite change and unexpected weight change.  HENT: Negative.   Eyes: Negative.   Respiratory: Negative for chest tightness and shortness of breath.   Cardiovascular: Negative for chest pain, palpitations and leg swelling.  Gastrointestinal: Negative.   Endocrine: Negative.   Genitourinary: Negative.   Neurological: Positive for light-headedness.  Psychiatric/Behavioral: Negative.    Past Medical History  Diagnosis Date  . HTN (hypertension)     history of  . HLD (hyperlipidemia)     history of  . Tremor   . BPH (benign prostatic hyperplasia)   . Sleep apnea   . GERD (gastroesophageal reflux disease)   . Colon polyps   . Erectile dysfunction   . IFG (impaired fasting glucose)    Family History  Problem Relation Age of Onset  . Heart defect Father     cabg  . Heart disease Father   . Diabetes Mother    History   Social History Narrative   Marital Status:  Married Ship broker)    Children:  Son Tyler Wiggins) Daughter Tyler Wiggins)    Pets:  Boxer/Lab/Shepard    Living Situation: Lives with wife and son.    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     Alcohol Use:  None    Drug Use:  None   Diet:  Regular   Exercise:  Racquetball    Hobbies:  Fishing          Objective:   Physical Exam  Constitutional: He appears well-nourished.  HENT:  Head: Normocephalic.  Nose: Nose normal.  Mouth/Throat: Oropharynx is clear and moist.  Eyes: Conjunctivae are normal. No scleral icterus.  Neck: Neck supple. No thyromegaly present.  Cardiovascular: Normal rate, regular rhythm and normal heart sounds.   Pulmonary/Chest: Effort normal and breath sounds normal.  Abdominal: Soft. He exhibits no mass. There is no tenderness.  Musculoskeletal: Normal range of motion.  Lymphadenopathy:    He has no cervical adenopathy.  Neurological: He is alert.  Skin: Skin is warm and dry. No rash noted.  Psychiatric: He has a normal mood and affect. His behavior is normal. Judgment and thought content normal.          Assessment & Plan:

## 2012-08-30 NOTE — Patient Instructions (Addendum)
1)  Sleep Apnea - We'll set you up for another sleep study/mask evaluation.  You can try some Provigil for excessive sleepiness.  2)  Colonoscopy - We'll set you up for a repeat.    3)  Blood Sugar - Stay on the Invokana.  You are going to compare the cost and how you feel on Janumet 50/1000 vs Kombiglyze.  Check with your insurance to see if both are covered and what your cost will be.  The bottles will say take twice a day but you will only take 1 at night.  Remember this has metformin in it.    Sleep Apnea  Sleep apnea is a sleep disorder characterized by abnormal pauses in breathing while you sleep. When your breathing pauses, the level of oxygen in your blood decreases. This causes you to move out of deep sleep and into light sleep. As a result, your quality of sleep is poor, and the system that carries your blood throughout your body (cardiovascular system) experiences stress. If sleep apnea remains untreated, the following conditions can develop:  High blood pressure (hypertension).  Coronary artery disease.  Inability to achieve or maintain an erection (impotence).  Impairment of your thought process (cognitive dysfunction). There are three types of sleep apnea: 1. Obstructive sleep apnea Pauses in breathing during sleep because of a blocked airway. 2. Central sleep apnea Pauses in breathing during sleep because the area of the brain that controls your breathing does not send the correct signals to the muscles that control breathing. 3. Mixed sleep apnea A combination of both obstructive and central sleep apnea. RISK FACTORS The following risk factors can increase your risk of developing sleep apnea:  Being overweight.  Smoking.  Having narrow passages in your nose and throat.  Being of older age.  Being male.  Alcohol use.  Sedative and tranquilizer use.  Ethnicity. Among individuals younger than 35 years, African Americans are at increased risk of sleep  apnea. SYMPTOMS   Difficulty staying asleep.  Daytime sleepiness and fatigue.  Loss of energy.  Irritability.  Loud, heavy snoring.  Morning headaches.  Trouble concentrating.  Forgetfulness.  Decreased interest in sex. DIAGNOSIS  In order to diagnose sleep apnea, your caregiver will perform a physical examination. Your caregiver may suggest that you take a home sleep test. Your caregiver may also recommend that you spend the night in a sleep lab. In the sleep lab, several monitors record information about your heart, lungs, and brain while you sleep. Your leg and arm movements and blood oxygen level are also recorded. TREATMENT The following actions may help to resolve mild sleep apnea:  Sleeping on your side.   Using a decongestant if you have nasal congestion.   Avoiding the use of depressants, including alcohol, sedatives, and narcotics.   Losing weight and modifying your diet if you are overweight. There also are devices and treatments to help open your airway:  Oral appliances. These are custom-made mouthpieces that shift your lower jaw forward and slightly open your bite. This opens your airway.  Devices that create positive airway pressure. This positive pressure "splints" your airway open to help you breathe better during sleep. The following devices create positive airway pressure:  Continuous positive airway pressure (CPAP) device. The CPAP device creates a continuous level of air pressure with an air pump. The air is delivered to your airway through a mask while you sleep. This continuous pressure keeps your airway open.  Nasal expiratory positive airway pressure (EPAP) device.  The EPAP device creates positive air pressure as you exhale. The device consists of single-use valves, which are inserted into each nostril and held in place by adhesive. The valves create very little resistance when you inhale but create much more resistance when you exhale. That  increased resistance creates the positive airway pressure. This positive pressure while you exhale keeps your airway open, making it easier to breath when you inhale again.  Bilevel positive airway pressure (BPAP) device. The BPAP device is used mainly in patients with central sleep apnea. This device is similar to the CPAP device because it also uses an air pump to deliver continuous air pressure through a mask. However, with the BPAP machine, the pressure is set at two different levels. The pressure when you exhale is lower than the pressure when you inhale.  Surgery. Typically, surgery is only done if you cannot comply with less invasive treatments or if the less invasive treatments do not improve your condition. Surgery involves removing excess tissue in your airway to create a wider passage way. Document Released: 02/20/2002 Document Revised: 09/01/2011 Document Reviewed: 07/09/2011 [facility] Patient Information 2014 [place], [place].

## 2012-08-31 ENCOUNTER — Encounter: Payer: Self-pay | Admitting: Gastroenterology

## 2012-08-31 ENCOUNTER — Telehealth: Payer: Self-pay

## 2012-08-31 MED ORDER — ROSUVASTATIN CALCIUM 20 MG PO TABS: 20.0000 mg | ORAL_TABLET | Freq: Every day | ORAL | Status: DC

## 2012-08-31 NOTE — Telephone Encounter (Addendum)
Colonoscopy has been scheduled [facility] with Dr. Christella Hartigan, [address] 3rd floor, [place], [phone]:  Pre-op appt. October 04, 2012 @ 8:30 am.  Procedure appt. October 18, 2012 @ 9:30 for 10:30 procedure.  I left a message on Tyler Wiggins vm informing him of this information and to call if he has any questions.  Tyler Wiggins called back stating that the apointments will not work for him so he will call the office to reschedule both appointments at this time.

## 2012-09-26 ENCOUNTER — Other Ambulatory Visit: Payer: Self-pay | Admitting: Family Medicine

## 2012-10-09 DIAGNOSIS — G250 Essential tremor: Secondary | ICD-10-CM | POA: Insufficient documentation

## 2012-10-09 DIAGNOSIS — G8929 Other chronic pain: Secondary | ICD-10-CM | POA: Insufficient documentation

## 2012-10-09 DIAGNOSIS — E785 Hyperlipidemia, unspecified: Secondary | ICD-10-CM | POA: Insufficient documentation

## 2012-10-09 DIAGNOSIS — Z1211 Encounter for screening for malignant neoplasm of colon: Secondary | ICD-10-CM | POA: Insufficient documentation

## 2012-10-09 DIAGNOSIS — R7301 Impaired fasting glucose: Secondary | ICD-10-CM | POA: Insufficient documentation

## 2012-10-09 DIAGNOSIS — K219 Gastro-esophageal reflux disease without esophagitis: Secondary | ICD-10-CM | POA: Insufficient documentation

## 2012-10-09 DIAGNOSIS — R400 Somnolence: Secondary | ICD-10-CM | POA: Insufficient documentation

## 2012-10-09 DIAGNOSIS — N400 Enlarged prostate without lower urinary tract symptoms: Secondary | ICD-10-CM | POA: Insufficient documentation

## 2012-10-09 DIAGNOSIS — G4730 Sleep apnea, unspecified: Secondary | ICD-10-CM | POA: Insufficient documentation

## 2012-10-09 DIAGNOSIS — I10 Essential (primary) hypertension: Secondary | ICD-10-CM | POA: Insufficient documentation

## 2012-10-09 NOTE — Assessment & Plan Note (Signed)
Refilled his Voltaren Gel.

## 2012-10-09 NOTE — Assessment & Plan Note (Signed)
He will compare the cost of Kombiglyze vs Janumet to decide which one he'll take along with Invokana.

## 2012-10-09 NOTE — Assessment & Plan Note (Signed)
Refilled his Flomax.

## 2012-10-09 NOTE — Assessment & Plan Note (Addendum)
Refilled his Crestor and fenofibrate.

## 2012-10-09 NOTE — Assessment & Plan Note (Signed)
Referred for a colonoscopy 

## 2012-10-09 NOTE — Assessment & Plan Note (Signed)
Sending for a sleep study.

## 2012-10-09 NOTE — Assessment & Plan Note (Signed)
Refilled his Prilosec.

## 2012-10-09 NOTE — Assessment & Plan Note (Signed)
Refilled his propranolol.

## 2012-10-09 NOTE — Assessment & Plan Note (Signed)
Refilled his Avalide

## 2012-10-09 NOTE — Assessment & Plan Note (Signed)
He was given a prescription for Provigil.

## 2012-10-10 ENCOUNTER — Telehealth: Payer: Self-pay

## 2012-10-10 NOTE — Telephone Encounter (Signed)
The patient has been scheduled for a sleep study on 10/17/2012. LB

## 2012-10-14 ENCOUNTER — Ambulatory Visit (HOSPITAL_BASED_OUTPATIENT_CLINIC_OR_DEPARTMENT_OTHER): Payer: 59 | Attending: Family Medicine | Admitting: Sleep Medicine

## 2012-10-14 VITALS — Ht 72.0 in | Wt 252.0 lb

## 2012-10-14 DIAGNOSIS — G4710 Hypersomnia, unspecified: Secondary | ICD-10-CM | POA: Insufficient documentation

## 2012-10-14 DIAGNOSIS — G4730 Sleep apnea, unspecified: Secondary | ICD-10-CM | POA: Insufficient documentation

## 2012-10-14 DIAGNOSIS — R0609 Other forms of dyspnea: Secondary | ICD-10-CM | POA: Insufficient documentation

## 2012-10-14 DIAGNOSIS — R6889 Other general symptoms and signs: Secondary | ICD-10-CM | POA: Insufficient documentation

## 2012-10-14 DIAGNOSIS — R0989 Other specified symptoms and signs involving the circulatory and respiratory systems: Secondary | ICD-10-CM | POA: Insufficient documentation

## 2012-10-14 DIAGNOSIS — R61 Generalized hyperhidrosis: Secondary | ICD-10-CM | POA: Insufficient documentation

## 2012-10-15 DIAGNOSIS — G4730 Sleep apnea, unspecified: Secondary | ICD-10-CM

## 2012-10-18 ENCOUNTER — Other Ambulatory Visit: Payer: 59 | Admitting: Gastroenterology

## 2012-10-24 ENCOUNTER — Ambulatory Visit (AMBULATORY_SURGERY_CENTER): Payer: 59 | Admitting: *Deleted

## 2012-10-24 VITALS — Ht 72.0 in | Wt 256.8 lb

## 2012-10-24 DIAGNOSIS — Z86010 Personal history of colonic polyps: Secondary | ICD-10-CM

## 2012-10-24 MED ORDER — MOVIPREP 100 G PO SOLR: ORAL | Status: DC

## 2012-10-24 NOTE — Progress Notes (Signed)
Pt says Valium makes him feel disoriented.  He has had Versed and Fentanyl for colon/egd procedures with no problems

## 2012-10-25 ENCOUNTER — Encounter: Payer: Self-pay | Admitting: Gastroenterology

## 2012-10-31 ENCOUNTER — Telehealth: Payer: Self-pay | Admitting: *Deleted

## 2012-10-31 NOTE — Telephone Encounter (Signed)
Yes, that 2007 polyp was adenomatous. OK for direct colonoscopy now for surveillance.        Thanks            ----- Message -----    From: Maple Hudson, RN    Sent: 10/24/2012 1:25 PM    To: Rachael Fee, MD              Dr Christella Hartigan: pt is scheduled for direct colon 8/26. Previsit is scheduled for today. Last colonoscopy 2007 with Dr. Virginia Rochester. Colon report is attached for your review. Pathology report and EGD done the same day are also in EPIC. Please review; pt okay for direct colon? Thanks, Olegario Messier           Pt notified 8/18 to proceed with colonoscopy as scheduled

## 2012-11-01 ENCOUNTER — Telehealth: Payer: Self-pay | Admitting: *Deleted

## 2012-11-01 NOTE — Telephone Encounter (Signed)
Patient is aware that he will be set up for CPAP machine through [facility]. Form was faxed back to [facility] to have patient setup for his equipment. PG

## 2012-11-08 ENCOUNTER — Ambulatory Visit (AMBULATORY_SURGERY_CENTER): Payer: 59 | Admitting: Gastroenterology

## 2012-11-08 ENCOUNTER — Encounter: Payer: Self-pay | Admitting: Gastroenterology

## 2012-11-08 VITALS — BP 125/77 | HR 70 | Temp 98.0°F | Resp 21 | Ht 72.0 in | Wt 256.0 lb

## 2012-11-08 DIAGNOSIS — K5730 Diverticulosis of large intestine without perforation or abscess without bleeding: Secondary | ICD-10-CM

## 2012-11-08 DIAGNOSIS — D126 Benign neoplasm of colon, unspecified: Secondary | ICD-10-CM

## 2012-11-08 DIAGNOSIS — Z860100 Personal history of colon polyps, unspecified: Secondary | ICD-10-CM

## 2012-11-08 MED ORDER — SODIUM CHLORIDE 0.9 % IV SOLN: 500.0000 mL | INTRAVENOUS | Status: DC

## 2012-11-08 NOTE — Patient Instructions (Addendum)
YOU HAD AN ENDOSCOPIC PROCEDURE TODAY AT THE [facility]: Refer to the procedure report that was given to you for any specific questions about what was found during the examination.  If the procedure report does not answer your questions, please call your gastroenterologist to clarify.  If you requested that your care partner not be given the details of your procedure findings, then the procedure report has been included in a sealed envelope for you to review at your convenience later.  YOU SHOULD EXPECT: Some feelings of bloating in the abdomen. Passage of more gas than usual.  Walking can help get rid of the air that was put into your GI tract during the procedure and reduce the bloating. If you had a lower endoscopy (such as a colonoscopy or flexible sigmoidoscopy) you may notice spotting of blood in your stool or on the toilet paper. If you underwent a bowel prep for your procedure, then you may not have a normal bowel movement for a few days.  DIET: Your first meal following the procedure should be a light meal and then it is ok to progress to your normal diet.  A half-sandwich or bowl of soup is an example of a good first meal.  Heavy or fried foods are harder to digest and may make you feel nauseous or bloated.  Likewise meals heavy in dairy and vegetables can cause extra gas to form and this can also increase the bloating.  Drink plenty of fluids but you should avoid alcoholic beverages for 24 hours.  ACTIVITY: Your care partner should take you home directly after the procedure.  You should plan to take it easy, moving slowly for the rest of the day.  You can resume normal activity the day after the procedure however you should NOT DRIVE or use heavy machinery for 24 hours (because of the sedation medicines used during the test).    SYMPTOMS TO REPORT IMMEDIATELY: A gastroenterologist can be reached at any hour.  During normal business hours, 8:30 AM to 5:00 PM Monday through Friday,  call [phone].  After hours and on weekends, please call the GI answering service at [phone]  Emergency number who will take a message and have the physician on call contact you.   Following lower endoscopy (colonoscopy or flexible sigmoidoscopy):  Excessive amounts of blood in the stool  Significant tenderness or worsening of abdominal pains  Swelling of the abdomen that is new, acute  Fever of 100F or higher   FOLLOW UP: If any biopsies were taken you will be contacted by phone or by letter within the next 1-3 weeks.  Call your gastroenterologist if you have not heard about the biopsies in 3 weeks.  Our staff will call the home number listed on your records the next business day following your procedure to check on you and address any questions or concerns that you may have at that time regarding the information given to you following your procedure. This is a courtesy call and so if there is no answer at the home number and we have not heard from you through the emergency physician on call, we will assume that you have returned to your regular daily activities without incident.  SIGNATURES/CONFIDENTIALITY: You and/or your care partner have signed paperwork which will be entered into your electronic medical record.  These signatures attest to the fact that that the information above on your After Visit Summary has been reviewed and is understood.  Full responsibility of  the confidentiality of this discharge information lies with you and/or your care-partner.  Handouts on diverticulosis, high fiber diet, polyps

## 2012-11-08 NOTE — Progress Notes (Signed)
Patient did not experience any of the following events: a burn prior to discharge; a fall within the facility; wrong site/side/patient/procedure/implant event; or a hospital transfer or hospital admission upon discharge from the facility. (G8907)Patient did not have preoperative order for IV antibiotic SSI prophylaxis. (G8918) ewm 

## 2012-11-08 NOTE — Op Note (Signed)
[facility] [address] [place], [address]   COLONOSCOPY PROCEDURE REPORT PATIENT: Tyler Wiggins, Tyler Wiggins  MR#: [id number] BIRTHDATE: 10-29-1950 , 62  yrs. old GENDER: Male ENDOSCOPIST: Rachael Fee, MD REFERRED HY:QMVHQ Zanard, M.D. PROCEDURE DATE:  11/08/2012 PROCEDURE:   Colonoscopy with snare polypectomy First Screening Colonoscopy - Avg.  risk and is 50 yrs.  old or older - No.  Prior Negative Screening - Now for repeat screening. N/A  History of Adenoma - Now for follow-up colonoscopy & has been > or = to 3 yrs.  Yes hx of adenoma.  Has been 3 or more years since last colonoscopy.  Polyps Removed Today? Yes. ASA CLASS:   Class II INDICATIONS:"large" adenomatous polyp removed by Dr.  Virginia Rochester in 2007, piecemeal; repeat colonoscopy 2008 no poylps. MEDICATIONS: Fentanyl 75 mcg IV, Versed 7 mg IV, and These medications were titrated to patient response per physician's verbal order  DESCRIPTION OF PROCEDURE:   After the risks benefits and alternatives of the procedure were thoroughly explained, informed consent was obtained.  A digital rectal exam revealed no abnormalities of the rectum.   The LB [id number]  endoscope was introduced through the anus and advanced to the cecum, which was identified by both the appendix and ileocecal valve. No adverse events experienced.   The quality of the prep was good.  The instrument was then slowly withdrawn as the colon was fully examined.  COLON FINDINGS: Four polyps were found, removed, three of them were retrieved and sent to pathology.  These were all sessile, ranged in size from 3mm to 6mm, located in descending segment, all removed with cold snare.  There were multiple diverticulum in the left colon.  The examination was otherwise normal.  Retroflexed views revealed no abnormalities. The time to cecum=3 minutes 19 seconds. Withdrawal time=10 minutes 48 seconds.  The scope was withdrawn and the procedure  completed. COMPLICATIONS: There were no complications.  ENDOSCOPIC IMPRESSION: Four polyps were found, removed, three of them were retrieved and sent to pathology. There were multiple diverticulum in the left colon. The examination was otherwise normal.  RECOMMENDATIONS: If the polyp(s) removed today are proven to be adenomatous (pre-cancerous) polyps, you will need a colonoscopy in 3-5 years. You will receive a letter within 1-2 weeks with the results of your biopsy as well as final recommendations.  Please call my office if you have not received a letter after 3 weeks.  eSigned:  Rachael Fee, MD 11/08/2012 9:02 AM

## 2012-11-09 ENCOUNTER — Telehealth: Payer: Self-pay | Admitting: *Deleted

## 2012-11-09 NOTE — Telephone Encounter (Signed)
  Follow up Call-  Call back number 11/08/2012  Post procedure Call Back phone  # [phone]  Permission to leave phone message Yes     Patient questions:  Do you have a fever, pain , or abdominal swelling? no Pain Score  0 *  Have you tolerated food without any problems? yes  Have you been able to return to your normal activities? yes  Do you have any questions about your discharge instructions: Diet   no Medications  no Follow up visit  no  Do you have questions or concerns about your Care? no  Actions: * If pain score is 4 or above: No action needed, pain <4.

## 2012-11-10 ENCOUNTER — Ambulatory Visit (INDEPENDENT_AMBULATORY_CARE_PROVIDER_SITE_OTHER): Payer: 59 | Admitting: Family Medicine

## 2012-11-10 ENCOUNTER — Encounter: Payer: Self-pay | Admitting: Family Medicine

## 2012-11-10 VITALS — BP 113/82 | HR 81 | Resp 16 | Ht 70.5 in | Wt 252.0 lb

## 2012-11-10 DIAGNOSIS — R7301 Impaired fasting glucose: Secondary | ICD-10-CM

## 2012-11-10 DIAGNOSIS — Z860100 Personal history of colon polyps, unspecified: Secondary | ICD-10-CM | POA: Insufficient documentation

## 2012-11-10 DIAGNOSIS — E785 Hyperlipidemia, unspecified: Secondary | ICD-10-CM

## 2012-11-10 DIAGNOSIS — Z5181 Encounter for therapeutic drug level monitoring: Secondary | ICD-10-CM

## 2012-11-10 DIAGNOSIS — R5381 Other malaise: Secondary | ICD-10-CM | POA: Insufficient documentation

## 2012-11-10 DIAGNOSIS — Z23 Encounter for immunization: Secondary | ICD-10-CM | POA: Insufficient documentation

## 2012-11-10 DIAGNOSIS — M25579 Pain in unspecified ankle and joints of unspecified foot: Secondary | ICD-10-CM | POA: Insufficient documentation

## 2012-11-10 DIAGNOSIS — M25571 Pain in right ankle and joints of right foot: Secondary | ICD-10-CM

## 2012-11-10 DIAGNOSIS — E559 Vitamin D deficiency, unspecified: Secondary | ICD-10-CM

## 2012-11-10 DIAGNOSIS — G4730 Sleep apnea, unspecified: Secondary | ICD-10-CM

## 2012-11-10 LAB — COMPLETE METABOLIC PANEL WITH GFR
ALT: 27 U/L (ref 0–53)
AST: 17 U/L (ref 0–37)
Albumin: 4.4 g/dL (ref 3.5–5.2)
Alkaline Phosphatase: 41 U/L (ref 39–117)
BUN: 15 mg/dL (ref 6–23)
CO2: 28 mEq/L (ref 19–32)
Calcium: 10.1 mg/dL (ref 8.4–10.5)
Chloride: 101 mEq/L (ref 96–112)
Creat: 1.35 mg/dL (ref 0.50–1.35)
GFR, Est African American: 66 mL/min
GFR, Est Non African American: 57 mL/min — ABNORMAL LOW
Glucose, Bld: 99 mg/dL (ref 70–99)
Potassium: 4.1 mEq/L (ref 3.5–5.3)
Sodium: 139 mEq/L (ref 135–145)
Total Bilirubin: 0.4 mg/dL (ref 0.3–1.2)
Total Protein: 7.2 g/dL (ref 6.0–8.3)

## 2012-11-10 LAB — LIPID PANEL
Cholesterol: 177 mg/dL (ref 0–200)
HDL: 26 mg/dL — ABNORMAL LOW (ref >39)
LDL Cholesterol: 93 mg/dL (ref 0–99)
Total CHOL/HDL Ratio: 6.8 Ratio
Triglycerides: 291 mg/dL — ABNORMAL HIGH (ref <150)
VLDL: 58 mg/dL — ABNORMAL HIGH (ref 0–40)

## 2012-11-10 LAB — CBC WITH DIFFERENTIAL/PLATELET
Basophils Absolute: 0 10*3/uL (ref 0.0–0.1)
Basophils Relative: 1 % (ref 0–1)
Eosinophils Absolute: 0.1 10*3/uL (ref 0.0–0.7)
Eosinophils Relative: 2 % (ref 0–5)
HCT: 52.5 % — ABNORMAL HIGH (ref 39.0–52.0)
Hemoglobin: 18.6 g/dL — ABNORMAL HIGH (ref 13.0–17.0)
Lymphocytes Relative: 23 % (ref 12–46)
Lymphs Abs: 1.4 10*3/uL (ref 0.7–4.0)
MCH: 31.4 pg (ref 26.0–34.0)
MCHC: 35.4 g/dL (ref 30.0–36.0)
MCV: 88.5 fL (ref 78.0–100.0)
Monocytes Absolute: 0.6 10*3/uL (ref 0.1–1.0)
Monocytes Relative: 10 % (ref 3–12)
Neutro Abs: 4 10*3/uL (ref 1.7–7.7)
Neutrophils Relative %: 64 % (ref 43–77)
Platelets: 202 10*3/uL (ref 150–400)
RBC: 5.93 MIL/uL — ABNORMAL HIGH (ref 4.22–5.81)
RDW: 14.5 % (ref 11.5–15.5)
WBC: 6.1 10*3/uL (ref 4.0–10.5)

## 2012-11-10 LAB — URIC ACID: Uric Acid, Serum: 3.7 mg/dL — ABNORMAL LOW (ref 4.0–7.8)

## 2012-11-10 LAB — HEMOGLOBIN A1C
Hgb A1c MFr Bld: 6 % — ABNORMAL HIGH (ref <5.7)
Mean Plasma Glucose: 126 mg/dL — ABNORMAL HIGH (ref <117)

## 2012-11-10 LAB — TSH: TSH: 2.742 u[IU]/mL (ref 0.350–4.500)

## 2012-11-10 NOTE — Assessment & Plan Note (Deleted)
Checking a TSH and CBC.   

## 2012-11-10 NOTE — Assessment & Plan Note (Signed)
Tyler Wiggins recently had a colonoscopy and says that he had 4 polyps removed.  This makes him nervous and he does not want to do 5 years for another one.  Given his family history and number that he grows, he may need one every 3 years.  I told him that he could do stool cards on the years he does not have a colonoscopy to help ease his fears.

## 2012-11-10 NOTE — Patient Instructions (Addendum)
1)  Right Foot Pain - You will need to follow up with an orthopedist to further evaluate your right foot.

## 2012-11-10 NOTE — Assessment & Plan Note (Signed)
We will check into what is the next step for his sleep apnea workup.
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
## 2012-11-10 NOTE — Assessment & Plan Note (Signed)
Checking a CMET and A1c.   

## 2012-11-10 NOTE — Assessment & Plan Note (Signed)
Checking his Vitamin D level.

## 2012-11-10 NOTE — Assessment & Plan Note (Signed)
Checking a lipid panel.

## 2012-11-10 NOTE — Assessment & Plan Note (Signed)
We are checking a uric acid level to be sure that he does not have gout.  If this is negative, I suggested that he return to Dr. Lajoyce Corners to see if another MRI would be warranted at this time.  If the fracture has not healed, I wonder if surgery would be indicated at this point.  We did discuss how weight loss would probably help his discomfort.  He continues to try to work on his diet and exercise.  His point pain does limit what he can do.

## 2012-11-11 LAB — INSULIN, RANDOM: Insulin: 25 u[IU]/mL (ref 3–28)

## 2012-11-11 LAB — INSULIN, FASTING: Insulin fasting, serum: 27 u[IU]/mL (ref 3–28)

## 2012-11-11 LAB — VITAMIN D 25 HYDROXY (VIT D DEFICIENCY, FRACTURES): Vit D, 25-Hydroxy: 54 ng/mL (ref 30–89)

## 2012-11-18 ENCOUNTER — Encounter: Payer: Self-pay | Admitting: Gastroenterology

## 2012-11-21 ENCOUNTER — Telehealth: Payer: Self-pay | Admitting: *Deleted

## 2012-11-29 ENCOUNTER — Encounter: Payer: Self-pay | Admitting: Family Medicine

## 2012-11-29 ENCOUNTER — Ambulatory Visit (INDEPENDENT_AMBULATORY_CARE_PROVIDER_SITE_OTHER): Payer: 59 | Admitting: Family Medicine

## 2012-11-29 VITALS — BP 113/76 | HR 74 | Resp 15 | Ht 70.5 in | Wt 254.0 lb

## 2012-11-29 DIAGNOSIS — D751 Secondary polycythemia: Secondary | ICD-10-CM

## 2012-11-29 DIAGNOSIS — K219 Gastro-esophageal reflux disease without esophagitis: Secondary | ICD-10-CM

## 2012-11-29 DIAGNOSIS — R81 Glycosuria: Secondary | ICD-10-CM

## 2012-11-29 DIAGNOSIS — R7301 Impaired fasting glucose: Secondary | ICD-10-CM

## 2012-11-29 LAB — COMPLETE METABOLIC PANEL WITH GFR
ALT: 33 U/L (ref 0–53)
AST: 27 U/L (ref 0–37)
Albumin: 4.2 g/dL (ref 3.5–5.2)
Alkaline Phosphatase: 33 U/L — ABNORMAL LOW (ref 39–117)
BUN: 18 mg/dL (ref 6–23)
CO2: 29 mEq/L (ref 19–32)
Calcium: 9.3 mg/dL (ref 8.4–10.5)
Chloride: 97 mEq/L (ref 96–112)
Creat: 1.25 mg/dL (ref 0.50–1.35)
GFR, Est African American: 72 mL/min
GFR, Est Non African American: 62 mL/min
Glucose, Bld: 87 mg/dL (ref 70–99)
Potassium: 4.3 mEq/L (ref 3.5–5.3)
Sodium: 135 mEq/L (ref 135–145)
Total Bilirubin: 0.9 mg/dL (ref 0.3–1.2)
Total Protein: 6.5 g/dL (ref 6.0–8.3)

## 2012-11-29 LAB — HEMOGLOBIN A1C
Hgb A1c MFr Bld: 5.8 % — ABNORMAL HIGH (ref <5.7)
Mean Plasma Glucose: 120 mg/dL — ABNORMAL HIGH (ref <117)

## 2012-11-29 LAB — POCT URINALYSIS DIPSTICK
Bilirubin, UA: NEGATIVE
Blood, UA: NEGATIVE
Glucose, UA: NEGATIVE
Ketones, UA: NEGATIVE
Leukocytes, UA: NEGATIVE
Nitrite, UA: NEGATIVE
Protein, UA: NEGATIVE
Spec Grav, UA: 1.01
Urobilinogen, UA: NEGATIVE
pH, UA: 6

## 2012-11-29 LAB — CBC WITH DIFFERENTIAL/PLATELET
Basophils Absolute: 0 10*3/uL (ref 0.0–0.1)
Basophils Relative: 1 % (ref 0–1)
Eosinophils Absolute: 0.1 10*3/uL (ref 0.0–0.7)
Eosinophils Relative: 2 % (ref 0–5)
HCT: 49.7 % (ref 39.0–52.0)
Hemoglobin: 17.2 g/dL — ABNORMAL HIGH (ref 13.0–17.0)
Lymphocytes Relative: 22 % (ref 12–46)
Lymphs Abs: 1.1 10*3/uL (ref 0.7–4.0)
MCH: 31.2 pg (ref 26.0–34.0)
MCHC: 34.6 g/dL (ref 30.0–36.0)
MCV: 90.2 fL (ref 78.0–100.0)
Monocytes Absolute: 0.5 10*3/uL (ref 0.1–1.0)
Monocytes Relative: 10 % (ref 3–12)
Neutro Abs: 3.3 10*3/uL (ref 1.7–7.7)
Neutrophils Relative %: 65 % (ref 43–77)
Platelets: 161 10*3/uL (ref 150–400)
RBC: 5.51 MIL/uL (ref 4.22–5.81)
RDW: 15.6 % — ABNORMAL HIGH (ref 11.5–15.5)
WBC: 5 10*3/uL (ref 4.0–10.5)
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
## 2012-12-01 NOTE — Telephone Encounter (Signed)
error 

## 2012-12-08 ENCOUNTER — Encounter: Payer: Self-pay | Admitting: Family Medicine

## 2012-12-08 DIAGNOSIS — D751 Secondary polycythemia: Secondary | ICD-10-CM | POA: Insufficient documentation

## 2012-12-08 DIAGNOSIS — R81 Glycosuria: Secondary | ICD-10-CM | POA: Insufficient documentation

## 2012-12-08 DIAGNOSIS — R7301 Impaired fasting glucose: Secondary | ICD-10-CM | POA: Insufficient documentation

## 2012-12-08 DIAGNOSIS — G4710 Hypersomnia, unspecified: Secondary | ICD-10-CM | POA: Insufficient documentation

## 2012-12-08 NOTE — Patient Instructions (Signed)
Insulin Resistance  Blood sugar (glucose) levels are controlled by a hormone called insulin. Insulin is made by your pancreas. When your blood glucose goes up, insulin is released into your blood. Insulin is required for your body to function normally. However, your body can become resistant to your own insulin or to insulin given to treat diabetes. In either case, insulin resistance can lead to serious problems. These problems include:   Type 2 diabetes.   Heart disease.   High blood pressure.   Stroke.   Polycystic ovary syndrome.   Fatty liver.  CAUSES   Insulin resistance can develop for many different reasons. It is more likely to happen in people with these conditions or characteristics:   Obesity.   Inactivity.   Pregnancy.   High blood pressure.   Stress.   Steroid use.   Infection or severe illness.   Increased levels of cholesterol and triglycerides.  SYMPTOMS   There are no symptoms. You may have symptoms related to the various complications of insulin resistance.   DIAGNOSIS   Several different things can make your caregiver suspect you have insulin resistance. These include:   High blood glucose (hyperglycemia).   Abnormal cholesterol levels.   High uric acid levels.   Changes related to blood pressure.   Changes related to inflammation.  Insulin resistance can be determined with blood tests. An elevated insulin level when you have not eaten might suggest resistance. Other more complicated tests are sometimes necessary.  TREATMENT   Lifestyle changes are the most important treatment for insulin resistance.    If you are overweight and you have insulin resistance, you can improve your insulin sensitivity by losing weight.   Moderate exercise for 30 40 minutes, 4 days a week, can improve insulin sensitivity.  Some medicines can also help improve your insulin sensitivity. Your caregiver can discuss these with you if they are appropriate.   HOME CARE INSTRUCTIONS    Do not smoke.    Keep your weight at a healthy level.   Get exercise.   If you have diabetes, follow your caregiver's directions.   If you have high blood pressure, follow your caregiver's directions.   Only take prescription medicines for pain, fever, or discomfort as directed by your caregiver.  SEEK MEDICAL CARE IF:    You are diabetic and you are having problems keeping your blood glucose levels at target range.   You are having episodes of low blood glucose (hypoglycemia).   You feel you might be having side effects from your medicines.   You have symptoms of an illness that is not improving after 3 4 days.   You have a sore or wound that is not healing.   You notice a change in vision or a new problem with your vision.  SEEK IMMEDIATE MEDICAL CARE IF:    Your blood glucose goes below 70, especially if you have confusion, lightheadedness, or other symptoms with it.   Your blood glucose is very high (as advised by your caregiver) twice in a row.   You pass out.   You have chest pain or trouble breathing.   You have a sudden, severe headache.   You have sudden weakness in one arm or one leg.   You have sudden difficulty speaking or swallowing.   You develop vomiting or diarrhea that is getting worse or not improving after 1 day.  Document Released: 04/21/2005 Document Revised: 09/01/2011 Document Reviewed: 04/19/2008  ExitCare Patient Information 2014 [employer].

## 2012-12-08 NOTE — Assessment & Plan Note (Addendum)
His hemoglobin in 2/14 was elevated at 21 g/dL and his hematocrit was 63.9%.  This can be explained by the testosterone treatment he has been receiving for a couple of years.  He gets testosterone pellets inserted by his urologist every 3 months.  He donated a pint of blood last week and his level is almost back to normal.  His hemoglobin was 17.2 g/dL and his hematocrit was 49.7.  He is very healthy and active and has no limitations to physical activity.

## 2012-12-08 NOTE — Assessment & Plan Note (Addendum)
The patient was diagnosed with Impaired Fasting Glucose a couple of years ago with his highest A1c being 6.4%.  He was originally given Bydureon back in 12/12 to not only help his sugars but his weight as well. He did not tolerate this medication and stopped it after a few weeks.  He has also been given a couple of different oral  medications over the past two years but says that he is currently not taking any medications.  He has been trying to work harder on diet and exercise and prefers to do this over pills.  We checked an A1c today and it was 5.8%.  His fasting blood sugar was also normal at 87.

## 2012-12-08 NOTE — Assessment & Plan Note (Addendum)
Vasco was given samples and a prescription for Invokana in 08/2011 to help lower his A1c and also to help him lose weight.  Invokana causes sugar to be in the urine.  He says that he was inconsistent with taking this medication and he does not recall being on it in February.  He thinks that this episode happened the day after he ate "4 bowls of ice cream at a church function".  He says that he is not currently taking Invokana.  We did check his urine today and it was negative for sugar.

## 2012-12-13 ENCOUNTER — Ambulatory Visit (INDEPENDENT_AMBULATORY_CARE_PROVIDER_SITE_OTHER): Payer: 59 | Admitting: Family Medicine

## 2012-12-13 VITALS — BP 121/83 | HR 70 | Resp 16 | Ht 70.5 in | Wt 250.0 lb

## 2012-12-13 DIAGNOSIS — R7301 Impaired fasting glucose: Secondary | ICD-10-CM

## 2012-12-13 DIAGNOSIS — E785 Hyperlipidemia, unspecified: Secondary | ICD-10-CM

## 2012-12-13 NOTE — Progress Notes (Signed)
  Subjective:    Patient ID: Tyler Wiggins, male    DOB: April 29, 1950, 62 y.o.   MRN: [id number]  HPI  Tyler Wiggins is here today to go over his most recent lab results and to discuss the condition listed below:     1)  IFG:  He continues to work hard on his diet and exercise.  He is not taking any medication for his blood sugar.  He has lost 4 lbs since his last office visit.   2)  Hyperlipidemia:  He is doing fine on Crestor and Fenofibrate.        Review of Systems  Constitutional: Negative.   HENT: Negative.   Eyes: Negative.   Respiratory: Negative.   Cardiovascular: Negative.   Gastrointestinal: Negative.   Endocrine: Negative.   Genitourinary: Negative.   Musculoskeletal: Negative.   Skin: Negative.   Allergic/Immunologic: Negative.   Neurological: Negative.   Hematological: Negative.   Psychiatric/Behavioral: Negative.    Past Medical History  Diagnosis Date  . Tremor   . BPH (benign prostatic hyperplasia)   . Sleep apnea   . GERD (gastroesophageal reflux disease)   . Colon polyps   . Erectile dysfunction   . IFG (impaired fasting glucose)   . Hyperlipidemia   . Hypertension     Family History  Problem Relation Age of Onset  . Heart disease Father     CABG  . Colon cancer Father 91  . Diabetes Father   . Cancer Mother     Brain Tumor   . Dementia Mother     History   Social History Narrative   Marital Status:  Married Ship broker)    Children:  Son Thayer Ohm) Daughter Cala Bradford)    Pets:  Boxer/Lab/Shepard    Living Situation: Lives with wife and son.    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       Alcohol Use:  None    Drug Use:  None   Diet:  Regular   Exercise:  Racquetball    Hobbies:  Fishing       Objective:   Physical Exam  Vitals reviewed. Constitutional: He  is oriented to person, place, and time. He appears well-developed and well-nourished.  Cardiovascular: Normal rate and regular rhythm.   Pulmonary/Chest: Effort normal and breath sounds normal.  Neurological: He is alert and oriented to person, place, and time.  Skin: Skin is warm and dry.  Psychiatric: He has a normal mood and affect.      Assessment & Plan:

## 2012-12-15 ENCOUNTER — Encounter: Payer: Self-pay | Admitting: Family Medicine

## 2012-12-18 ENCOUNTER — Encounter: Payer: Self-pay | Admitting: Family Medicine

## 2012-12-18 DIAGNOSIS — R5381 Other malaise: Secondary | ICD-10-CM | POA: Insufficient documentation

## 2012-12-18 DIAGNOSIS — Z5181 Encounter for therapeutic drug level monitoring: Secondary | ICD-10-CM | POA: Insufficient documentation

## 2012-12-18 NOTE — Assessment & Plan Note (Signed)
His sugar looks great.  He will continue to work on diet and exercise.

## 2012-12-18 NOTE — Assessment & Plan Note (Signed)
His triglycerides remain high.  He will continue to work on his diet and exercise.

## 2013-06-06 ENCOUNTER — Other Ambulatory Visit: Payer: Self-pay | Admitting: Urology

## 2013-07-07 ENCOUNTER — Encounter (HOSPITAL_BASED_OUTPATIENT_CLINIC_OR_DEPARTMENT_OTHER): Payer: Self-pay | Admitting: *Deleted

## 2013-07-07 NOTE — Progress Notes (Signed)
NPO AFTER MN. ARRIVE AT 0715. NEEDS ISTAT AND EKG.  REVIEWED [facility] GUIDELINES , WILL BRING MEDS AND CPAP.

## 2013-07-10 ENCOUNTER — Encounter (HOSPITAL_BASED_OUTPATIENT_CLINIC_OR_DEPARTMENT_OTHER): Payer: Self-pay | Admitting: *Deleted

## 2013-07-10 ENCOUNTER — Ambulatory Visit (HOSPITAL_BASED_OUTPATIENT_CLINIC_OR_DEPARTMENT_OTHER): Payer: 59 | Admitting: Anesthesiology

## 2013-07-10 ENCOUNTER — Encounter (HOSPITAL_BASED_OUTPATIENT_CLINIC_OR_DEPARTMENT_OTHER): Payer: 59 | Admitting: Anesthesiology

## 2013-07-10 ENCOUNTER — Encounter (HOSPITAL_BASED_OUTPATIENT_CLINIC_OR_DEPARTMENT_OTHER)
Admission: RE | Disposition: A | Discharge status: Home / Self Care | Payer: Self-pay | Source: Ambulatory Visit | Attending: Urology

## 2013-07-10 ENCOUNTER — Ambulatory Visit (HOSPITAL_BASED_OUTPATIENT_CLINIC_OR_DEPARTMENT_OTHER)
Admission: RE | Admit: 2013-07-10 | Discharge: 2013-07-11 | Disposition: A | Discharge status: Home / Self Care | Payer: 59 | Source: Ambulatory Visit | Attending: Urology | Admitting: Urology

## 2013-07-10 DIAGNOSIS — E7800 Pure hypercholesterolemia, unspecified: Secondary | ICD-10-CM | POA: Insufficient documentation

## 2013-07-10 DIAGNOSIS — N400 Enlarged prostate without lower urinary tract symptoms: Secondary | ICD-10-CM | POA: Diagnosis present

## 2013-07-10 DIAGNOSIS — Z79899 Other long term (current) drug therapy: Secondary | ICD-10-CM | POA: Insufficient documentation

## 2013-07-10 DIAGNOSIS — I10 Essential (primary) hypertension: Secondary | ICD-10-CM | POA: Insufficient documentation

## 2013-07-10 DIAGNOSIS — N529 Male erectile dysfunction, unspecified: Secondary | ICD-10-CM | POA: Insufficient documentation

## 2013-07-10 DIAGNOSIS — Z87891 Personal history of nicotine dependence: Secondary | ICD-10-CM | POA: Insufficient documentation

## 2013-07-10 DIAGNOSIS — N401 Enlarged prostate with lower urinary tract symptoms: Secondary | ICD-10-CM | POA: Insufficient documentation

## 2013-07-10 DIAGNOSIS — E291 Testicular hypofunction: Secondary | ICD-10-CM | POA: Insufficient documentation

## 2013-07-10 HISTORY — DX: Personal history of colon polyps, unspecified: Z86.0100

## 2013-07-10 HISTORY — DX: Personal history of colonic polyps: Z86.010

## 2013-07-10 HISTORY — PX: TRANSURETHRAL RESECTION OF PROSTATE: SHX73

## 2013-07-10 HISTORY — DX: Obstructive sleep apnea (adult) (pediatric): G47.33

## 2013-07-10 HISTORY — DX: Unspecified symptoms and signs involving the genitourinary system: R39.9

## 2013-07-10 HISTORY — DX: Presence of spectacles and contact lenses: Z97.3

## 2013-07-10 HISTORY — DX: Dependence on other enabling machines and devices: Z99.89

## 2013-07-10 HISTORY — DX: Other cervical disc degeneration, unspecified cervical region: M50.30

## 2013-07-10 HISTORY — DX: Essential tremor: G25.0

## 2013-07-10 LAB — POCT I-STAT, CHEM 8
BUN: 16 mg/dL (ref 6–23)
Calcium, Ion: 1.29 mmol/L (ref 1.13–1.30)
Chloride: 103 mEq/L (ref 96–112)
Creatinine, Ser: 1.3 mg/dL (ref 0.50–1.35)
Glucose, Bld: 120 mg/dL — ABNORMAL HIGH (ref 70–99)
HCT: 54 % — ABNORMAL HIGH (ref 39.0–52.0)
Hemoglobin: 18.4 g/dL — ABNORMAL HIGH (ref 13.0–17.0)
Potassium: 3.9 mEq/L (ref 3.7–5.3)
Sodium: 143 mEq/L (ref 137–147)
TCO2: 27 mmol/L (ref 0–100)

## 2013-07-10 LAB — GLUCOSE, CAPILLARY: GLUCOSE-CAPILLARY: 127 mg/dL — AB (ref 70–99)

## 2013-07-10 SURGERY — TRANSURETHRAL RESECTION OF THE PROSTATE WITH GYRUS INSTRUMENTS
Anesthesia: General | Site: Bladder

## 2013-07-10 MED ORDER — DEXAMETHASONE SODIUM PHOSPHATE 4 MG/ML IJ SOLN
INTRAMUSCULAR | Status: DC | PRN
  Administered 2013-07-10: 10 mg via INTRAVENOUS

## 2013-07-10 MED ORDER — DIPHENHYDRAMINE HCL 50 MG/ML IJ SOLN
12.5000 mg | Freq: Four times a day (QID) | INTRAMUSCULAR | Status: DC | PRN
  Filled 2013-07-10: qty 0.5

## 2013-07-10 MED ORDER — SENNOSIDES-DOCUSATE SODIUM 8.6-50 MG PO TABS
2.0000 | ORAL_TABLET | Freq: Every day | ORAL | Status: DC
  Filled 2013-07-10: qty 2

## 2013-07-10 MED ORDER — ONDANSETRON HCL 4 MG/2ML IJ SOLN
INTRAMUSCULAR | Status: DC | PRN
Start: 2013-07-10 — End: 2013-07-10
  Administered 2013-07-10: 4 mg via INTRAVENOUS

## 2013-07-10 MED ORDER — MIDAZOLAM HCL 2 MG/2ML IJ SOLN
INTRAMUSCULAR | Status: AC
  Filled 2013-07-10: qty 2

## 2013-07-10 MED ORDER — KETOROLAC TROMETHAMINE 30 MG/ML IJ SOLN
INTRAMUSCULAR | Status: DC | PRN
  Administered 2013-07-10: 30 mg via INTRAVENOUS

## 2013-07-10 MED ORDER — SODIUM CHLORIDE 0.9 % IR SOLN
Status: DC | PRN
  Administered 2013-07-10 (×2): 3000 mL via INTRAVESICAL

## 2013-07-10 MED ORDER — PANTOPRAZOLE SODIUM 40 MG PO TBEC
40.0000 mg | DELAYED_RELEASE_TABLET | Freq: Every day | ORAL | Status: DC
  Filled 2013-07-10: qty 1

## 2013-07-10 MED ORDER — FENOFIBRATE 160 MG PO TABS
160.0000 mg | ORAL_TABLET | Freq: Every evening | ORAL | Status: DC
  Filled 2013-07-10: qty 1

## 2013-07-10 MED ORDER — IRBESARTAN-HYDROCHLOROTHIAZIDE 150-12.5 MG PO TABS: 1.0000 | ORAL_TABLET | Freq: Every morning | ORAL | Status: DC

## 2013-07-10 MED ORDER — ONDANSETRON HCL 4 MG/2ML IJ SOLN
4.0000 mg | INTRAMUSCULAR | Status: DC | PRN
  Filled 2013-07-10: qty 2

## 2013-07-10 MED ORDER — CEFAZOLIN SODIUM 1-5 GM-% IV SOLN
1.0000 g | INTRAVENOUS | Status: AC
  Administered 2013-07-10: 2 g via INTRAVENOUS
  Filled 2013-07-10: qty 50

## 2013-07-10 MED ORDER — FENTANYL CITRATE 0.05 MG/ML IJ SOLN
25.0000 ug | INTRAMUSCULAR | Status: DC | PRN
  Administered 2013-07-10: 50 ug via INTRAVENOUS
  Filled 2013-07-10: qty 1

## 2013-07-10 MED ORDER — OXYCODONE-ACETAMINOPHEN 5-325 MG PO TABS
1.0000 | ORAL_TABLET | ORAL | Status: DC | PRN
  Filled 2013-07-10: qty 2

## 2013-07-10 MED ORDER — PROMETHAZINE HCL 25 MG/ML IJ SOLN
6.2500 mg | INTRAMUSCULAR | Status: DC | PRN
  Filled 2013-07-10: qty 1

## 2013-07-10 MED ORDER — CIPROFLOXACIN HCL 500 MG PO TABS
ORAL_TABLET | ORAL | Status: AC
  Filled 2013-07-10: qty 1

## 2013-07-10 MED ORDER — KETOROLAC TROMETHAMINE 30 MG/ML IJ SOLN
15.0000 mg | Freq: Once | INTRAMUSCULAR | Status: AC | PRN
  Filled 2013-07-10: qty 1

## 2013-07-10 MED ORDER — PROPRANOLOL HCL 20 MG PO TABS
20.0000 mg | ORAL_TABLET | Freq: Two times a day (BID) | ORAL | Status: DC
  Filled 2013-07-10: qty 1

## 2013-07-10 MED ORDER — HYDROMORPHONE HCL PF 1 MG/ML IJ SOLN
INTRAMUSCULAR | Status: AC
  Filled 2013-07-10: qty 1

## 2013-07-10 MED ORDER — ATORVASTATIN CALCIUM 10 MG PO TABS
10.0000 mg | ORAL_TABLET | Freq: Every day | ORAL | Status: DC
  Filled 2013-07-10: qty 1

## 2013-07-10 MED ORDER — SODIUM CHLORIDE 0.45 % IV SOLN
INTRAVENOUS | Status: DC
  Administered 2013-07-10: 12:00:00 via INTRAVENOUS
  Filled 2013-07-10: qty 1000

## 2013-07-10 MED ORDER — OXYBUTYNIN CHLORIDE 5 MG PO TABS
5.0000 mg | ORAL_TABLET | Freq: Three times a day (TID) | ORAL | Status: DC | PRN
  Filled 2013-07-10: qty 1

## 2013-07-10 MED ORDER — OMEGA-3 FATTY ACIDS 1000 MG PO CAPS
2.0000 g | ORAL_CAPSULE | Freq: Every day | ORAL | Status: DC
Start: 2013-07-10 — End: 2013-07-11

## 2013-07-10 MED ORDER — HYDROMORPHONE HCL PF 1 MG/ML IJ SOLN
0.5000 mg | INTRAMUSCULAR | Status: DC | PRN
  Administered 2013-07-10: 0.5 mg via INTRAVENOUS
  Filled 2013-07-10: qty 1

## 2013-07-10 MED ORDER — MIDAZOLAM HCL 5 MG/5ML IJ SOLN
INTRAMUSCULAR | Status: DC | PRN
  Administered 2013-07-10: 2 mg via INTRAVENOUS

## 2013-07-10 MED ORDER — DIPHENHYDRAMINE HCL 12.5 MG/5ML PO ELIX
12.5000 mg | ORAL_SOLUTION | Freq: Four times a day (QID) | ORAL | Status: DC | PRN
  Filled 2013-07-10: qty 10

## 2013-07-10 MED ORDER — LIDOCAINE HCL (CARDIAC) 20 MG/ML IV SOLN
INTRAVENOUS | Status: DC | PRN
  Administered 2013-07-10: 80 mg via INTRAVENOUS

## 2013-07-10 MED ORDER — FENTANYL CITRATE 0.05 MG/ML IJ SOLN
INTRAMUSCULAR | Status: AC
  Filled 2013-07-10: qty 2

## 2013-07-10 MED ORDER — CIPROFLOXACIN HCL 500 MG PO TABS
500.0000 mg | ORAL_TABLET | Freq: Two times a day (BID) | ORAL | Status: DC
  Administered 2013-07-10 (×2): 500 mg via ORAL
  Filled 2013-07-10: qty 1

## 2013-07-10 MED ORDER — LACTATED RINGERS IV SOLN
INTRAVENOUS | Status: DC
  Administered 2013-07-10 (×3): via INTRAVENOUS
  Filled 2013-07-10: qty 1000

## 2013-07-10 MED ORDER — FENTANYL CITRATE 0.05 MG/ML IJ SOLN
INTRAMUSCULAR | Status: DC | PRN
  Administered 2013-07-10: 50 ug via INTRAVENOUS
  Administered 2013-07-10 (×2): 25 ug via INTRAVENOUS

## 2013-07-10 MED ORDER — CYCLOBENZAPRINE HCL 10 MG PO TABS
10.0000 mg | ORAL_TABLET | Freq: Three times a day (TID) | ORAL | Status: DC | PRN
  Filled 2013-07-10: qty 1

## 2013-07-10 MED ORDER — FENTANYL CITRATE 0.05 MG/ML IJ SOLN
INTRAMUSCULAR | Status: AC
  Filled 2013-07-10: qty 6

## 2013-07-10 MED ORDER — EPHEDRINE SULFATE 50 MG/ML IJ SOLN
INTRAMUSCULAR | Status: DC | PRN
  Administered 2013-07-10 (×2): 5 mg via INTRAVENOUS

## 2013-07-10 MED ORDER — BACITRACIN-NEOMYCIN-POLYMYXIN 400-5-5000 EX OINT
1.0000 "application " | TOPICAL_OINTMENT | Freq: Three times a day (TID) | CUTANEOUS | Status: DC | PRN
  Filled 2013-07-10: qty 1

## 2013-07-10 MED ORDER — ACETAMINOPHEN 10 MG/ML IV SOLN
INTRAVENOUS | Status: DC | PRN
  Administered 2013-07-10: 1000 mg via INTRAVENOUS

## 2013-07-10 MED ORDER — PROPOFOL 10 MG/ML IV BOLUS
INTRAVENOUS | Status: DC | PRN
  Administered 2013-07-10: 180 mg via INTRAVENOUS

## 2013-07-10 SURGICAL SUPPLY — 38 items
BAG DRAIN URO-CYSTO SKYTR STRL (DRAIN) ×3 IMPLANT
BAG URINE DRAINAGE (UROLOGICAL SUPPLIES) IMPLANT
BAG URINE LEG 19OZ MD ST LTX (BAG) IMPLANT
BLADE SURG 15 STRL LF DISP TIS (BLADE) IMPLANT
BLADE SURG 15 STRL SS (BLADE)
BOOTIES KNEE HIGH SLOAN (MISCELLANEOUS) ×3 IMPLANT
CANISTER SUCT LVC 12 LTR MEDI- (MISCELLANEOUS) ×12 IMPLANT
CATH AINSWORTH 30CC 24FR (CATHETERS) IMPLANT
CATH FOLEY 2WAY SLVR  5CC 14FR (CATHETERS)
CATH FOLEY 2WAY SLVR  5CC 20FR (CATHETERS)
CATH FOLEY 2WAY SLVR 5CC 14FR (CATHETERS) IMPLANT
CATH FOLEY 2WAY SLVR 5CC 20FR (CATHETERS) IMPLANT
CATH HEMA 3WAY 30CC 24FR COUDE (CATHETERS) IMPLANT
CATH HEMA 3WAY 30CC 24FR RND (CATHETERS) IMPLANT
CATH SIMPLASTIC 24 30ML (CATHETERS) IMPLANT
CLOTH BEACON ORANGE TIMEOUT ST (SAFETY) ×3 IMPLANT
DRAPE CAMERA CLOSED 9X96 (DRAPES) ×3 IMPLANT
ELECT BUTTON HF 24-28F 2 30DE (ELECTRODE) ×3 IMPLANT
ELECT LOOP HF 24-28F (CUTTING LOOP) IMPLANT
ELECT LOOP HF 26F 30D .35MM (CUTTING LOOP) IMPLANT
ELECT NEEDLE 45D HF 24-28F 12D (CUTTING LOOP) IMPLANT
ELECT REM PT RETURN 9FT ADLT (ELECTROSURGICAL) ×3
ELECTRODE REM PT RTRN 9FT ADLT (ELECTROSURGICAL) ×1 IMPLANT
GLOVE BIO SURGEON STRL SZ7.5 (GLOVE) ×3 IMPLANT
GLOVE BIOGEL M 6.5 STRL (GLOVE) ×3 IMPLANT
GLOVE BIOGEL M STER SZ 6 (GLOVE) IMPLANT
GLOVE INDICATOR 6.5 STRL GRN (GLOVE) ×6 IMPLANT
GOWN PREVENTION PLUS LG XLONG (DISPOSABLE) ×3 IMPLANT
GOWN STRL REIN XL XLG (GOWN DISPOSABLE) ×3 IMPLANT
HOLDER FOLEY CATH W/STRAP (MISCELLANEOUS) IMPLANT
LOOP CUTTING 24FR OLYMPUS (CUTTING LOOP) IMPLANT
PACK CYSTOSCOPY (CUSTOM PROCEDURE TRAY) ×3 IMPLANT
PLUG CATH AND CAP STER (CATHETERS) IMPLANT
SET ASPIRATION TUBING (TUBING) IMPLANT
SUT ETHILON 3 0 PS 1 (SUTURE) IMPLANT
SUT SILK 0 TIES 10X30 (SUTURE) IMPLANT
SYR 30ML LL (SYRINGE) IMPLANT
SYRINGE IRR TOOMEY STRL 70CC (SYRINGE) ×3 IMPLANT

## 2013-07-10 NOTE — Anesthesia Preprocedure Evaluation (Signed)
Anesthesia Evaluation  Patient identified by MRN, date of birth, ID band Patient awake    Reviewed: Allergy & Precautions, H&P , NPO status , Patient's Chart, lab work & pertinent test results  Airway Mallampati: II TM Distance: <3 FB Neck ROM: Limited    Dental no notable dental hx.    Pulmonary sleep apnea and Continuous Positive Airway Pressure Ventilation , former smoker,  breath sounds clear to auscultation  Pulmonary exam normal       Cardiovascular hypertension, Pt. on medications and Pt. on home beta blockers Rhythm:Regular Rate:Normal     Neuro/Psych negative neurological ROS  negative psych ROS   GI/Hepatic negative GI ROS, Neg liver ROS,   Endo/Other  Morbid obesity  Renal/GU negative Renal ROS  negative genitourinary   Musculoskeletal negative musculoskeletal ROS (+)   Abdominal   Peds negative pediatric ROS (+)  Hematology negative hematology ROS (+)   Anesthesia Other Findings   Reproductive/Obstetrics negative OB ROS                           Anesthesia Physical Anesthesia Plan  ASA: III  Anesthesia Plan: General   Post-op Pain Management:    Induction: Intravenous  Airway Management Planned: LMA  Additional Equipment:   Intra-op Plan:   Post-operative Plan:   Informed Consent: I have reviewed the patients History and Physical, chart, labs and discussed the procedure including the risks, benefits and alternatives for the proposed anesthesia with the patient or authorized representative who has indicated his/her understanding and acceptance.   Dental advisory given  Plan Discussed with: CRNA  Anesthesia Plan Comments:         Anesthesia Quick Evaluation

## 2013-07-10 NOTE — Op Note (Signed)
Pre-operative diagnosis :   BPH  Postoperative diagnosis:  Same  Operation: Cystourethroscopy, TURP   Surgeon:  S. Gaynelle Arabian, MD  First assistant:  None  Anesthesia:  General LMA  Preparation:  After appropriate preanesthesia, the patient was brought the operating room, placed on the operating table in the dorsal supine position where general LMA anesthesia was introduced. He was replaced in the dorsal lithotomy position where the pubis was prepped with Betadine solution and draped in usual fashion. The armband was double checked. The history was double checked.  Review history:  Problems  1. Benign prostatic hyperplasia with urinary obstruction (600.01,599.69)  2. Erectile dysfunction due to arterial insufficiency (607.84)  3. Hypogonadism, testicular (257.2)   Assessed By: Carolan Clines (Urology); Last Assessed: 02 Jun 2013  History of Present Illness  63 yo male, patient of Dr. Cy Blamer, that returns today for a 7 mo f/u & Testopel insertion. He states that he has noticed symptoms related to hypogonadism (more fatigue, low energy & decrease in libido). Hx of benign prostatic hyperplasia and outlet obstructive symptoms. He would like to go ahead with a BPH surgery (previously discussed CTT vs laser TURP (leaning towards TURP). His testosterone responded very well with the Testopel. He has felt that this has resulted in a substantial improvement in his situation. He has also been on some daily Cialis and his sexual functioning has become much less of an issue. He has not noticed any obvious improvement in his overall voiding on the Cialis. He remains on 2 tables of generic tamsulosin a day. He would like to get off the medication if possible. When he misses the medication his voiding is markedly difficult.  He has had cystoscopy and PUS showing BPH and no median lobe, and peak flow of 11cc/sae, and pvr=151cc. IPSS=25, despite double dose Flomax. Daily cialis helped his urge symptom,  which 0.8mg  flomax has not. He will try rapaflo while waiting for microwave thermotherapy.  We have considered options for him, including microwave, TUNA, laser, and Gyrus TURP. Microwave is the least interventional, but will give him a reasonable result , for 5 years.      Statement of  Likelihood of Success: Excellent. TIME-OUT observed.:  Procedure: Cystourethroscopy revealed markedly elevated bladder neck, with bilobar BPH. Resection was accomplished from the 7:00 to 5 position, and then from the 10:00 to 7:00 position, and from the 2:00 to the 5:00 positions. Chips were evacuated from the bladder with the piston syringe, and extensive cauterization was accomplished. A size 24 three-way hematuria catheter was placed to traction and CBI. The patient was awakened, and taken to recovery room in good condition. Chips were sent to laboratory after separate timeout was observed.

## 2013-07-10 NOTE — Transfer of Care (Signed)
Immediate Anesthesia Transfer of Care Note  Patient: Tyler Wiggins  Procedure(s) Performed: Procedure(s) (LRB): TRANSURETHRAL RESECTION OF THE PROSTATE WITH GYRUS INSTRUMENTS/POSSIBLE BLADDER NECK INCISION (N/A)  Patient Location: PACU  Anesthesia Type: General  Level of Consciousness: awake, oriented, sedated and patient cooperative  Airway & Oxygen Therapy: Patient Spontanous Breathing and Patient connected to face mask oxygen  Post-op Assessment: Report given to PACU RN and Post -op Vital signs reviewed and stable  Post vital signs: Reviewed and stable  Complications: No apparent anesthesia complications

## 2013-07-10 NOTE — Anesthesia Procedure Notes (Signed)
Procedure Name: LMA Insertion Date/Time: 07/10/2013 9:11 AM Performed by: Denna Haggard D Pre-anesthesia Checklist: Patient identified, Emergency Drugs available, Suction available and Patient being monitored Patient Re-evaluated:Patient Re-evaluated prior to inductionOxygen Delivery Method: Circle System Utilized Preoxygenation: Pre-oxygenation with 100% oxygen Intubation Type: IV induction Ventilation: Mask ventilation without difficulty LMA: LMA inserted LMA Size: 5.0 Number of attempts: 1 Airway Equipment and Method: bite block Placement Confirmation: positive ETCO2 Tube secured with: Tape Dental Injury: Teeth and Oropharynx as per pre-operative assessment

## 2013-07-10 NOTE — Interval H&P Note (Signed)
History and Physical Interval Note:  07/10/2013 8:48 AM  Tyler Wiggins  has presented today for surgery, with the diagnosis of bph  The various methods of treatment have been discussed with the patient and family. After consideration of risks, benefits and other options for treatment, the patient has consented to  Procedure(s): TRANSURETHRAL RESECTION OF THE PROSTATE WITH GYRUS INSTRUMENTS/POSSIBLE BLADDER NECK INCISION (N/A) as a surgical intervention .  The patient's history has been reviewed, patient examined, no change in status, stable for surgery.  I have reviewed the patient's chart and labs.  Questions were answered to the patient's satisfaction.     Ailene Rud

## 2013-07-10 NOTE — Anesthesia Postprocedure Evaluation (Signed)
  Anesthesia Post-op Note  Patient: Tyler Wiggins  Procedure(s) Performed: Procedure(s) (LRB): TRANSURETHRAL RESECTION OF THE PROSTATE WITH GYRUS INSTRUMENTS/POSSIBLE BLADDER NECK INCISION (N/A)  Patient Location: PACU  Anesthesia Type: General  Level of Consciousness: awake and alert   Airway and Oxygen Therapy: Patient Spontanous Breathing  Post-op Pain: mild  Post-op Assessment: Post-op Vital signs reviewed, Patient's Cardiovascular Status Stable, Respiratory Function Stable, Patent Airway and No signs of Nausea or vomiting  Last Vitals:  Filed Vitals:   07/10/13 1007  BP:   Pulse: 67  Temp: 36.4 C  Resp: 20    Post-op Vital Signs: stable   Complications: No apparent anesthesia complications

## 2013-07-10 NOTE — H&P (Signed)
Reason For Visit 7 mo f/u & Testopel insertion   Active Problems Problems  1. Benign prostatic hyperplasia with urinary obstruction (600.01,599.69) 2. Erectile dysfunction due to arterial insufficiency (607.84) 3. Hypogonadism, testicular (257.2)   Assessed By: Carolan Clines (Urology); Last Assessed: 02 Jun 2013  History of Present Illness     63 yo male, patient of Dr. Cy Blamer, that returns today for a 7 mo f/u & Testopel insertion. He states that he has noticed symptoms related to hypogonadism (more fatigue, low energy & decrease in libido). Hx of benign prostatic hyperplasia and outlet obstructive symptoms. He would like to go ahead with a BPH surgery (previously discussed CTT vs laser TURP (leaning towards TURP). His testosterone responded very well with the Testopel. He has felt that this has resulted in a substantial improvement in his situation. He has also been on some daily Cialis and his sexual functioning has become much less of an issue. He has not noticed any obvious improvement in his overall voiding on the Cialis. He remains on 2 tables of generic tamsulosin a day. He would like to get off the medication if possible. When he misses the medication his voiding is markedly difficult.     He has had cystoscopy and PUS showing BPH and no median lobe, and peak flow of 11cc/sae, and pvr=151cc. IPSS=25, despite double dose Flomax. Daily cialis helped his urge symptom, which 0.8mg  flomax has not. He will try rapaflo while waiting for microwave thermotherapy.      We have considered options for him, including microwave, TUNA, laser, and Gyrus TURP. Microwave is the least interventional, but will give him a reasonable result , for 5 years.     05/26/13 labs: Testosterone - 196 and PSA - 0.39  11/28/12 Testosterone - 1037  09/27/12 labs: Testosterone - 326, Estradiol - 31.9, Hct - 55.2% and Hgb - 19.5  04/22/12 Testosterone - 390  01/25/12 Testosterone - 1307.20  12/16/11  Testosterone - 338.91  09/14/11 Testosterone - 892.09  08/11/11 Testosterone - 356.92   Past Medical History Problems  1. History of Chronic Reflux Esophagitis (530.11) 2. Former smoker (V15.82) 3. History of hypercholesterolemia (V12.29) 4. History of hypercholesterolemia (V12.29) 5. History of hypertension (V12.59) 6. History of hypertension (V12.59) 7. History of Urinary Calculus (592.9)  Surgical History Problems  1. History of Back Surgery 2. History of Neck Surgery  Current Meds 1. Alfuzosin HCl ER 10 MG Oral Tablet Extended Release 24 Hour; 1HS - TAKE ONE  TABLET BY MOUTH AT BEDTIME;  Therapy: 03Sep2014 to (Evaluate:03Oct2014)  Requested for: 03Sep2014; Last  Rx:03Sep2014 Ordered 2. Bystolic 20 MG Oral Tablet;  Therapy: [id number] to Recorded 3. Crestor 10 MG Oral Tablet; 1 per day;  Therapy: (Recorded:05Jun2012) to Recorded 4. Cyclobenzaprine HCl - 10 MG Oral Tablet;  Therapy: [id number] to Recorded 5. Lovaza 1 GM Oral Capsule;  Therapy: [id number] to Recorded 6. MetFORMIN HCl ER 500 MG Oral Tablet Extended Release 24 Hour;  Therapy: [id number] to Recorded 7. Omeprazole 20 MG Oral Capsule Delayed Release; 1 per day;  Therapy: (Recorded:05Jun2012) to Recorded 8. Testopel 75 MG Implant Pellet;  Therapy: (Recorded:29Jan2013) to Recorded 9. Trilipix 135 MG Oral Capsule Delayed Release;  Therapy: [id number] to Recorded  Allergies Medication  1. No Known Drug Allergies  Family History Problems  1. Family history of Cardiac Failure 2. Family history of Diabetes Mellitus (V18.0) 3. Family history of Diabetes Mellitus (V18.0) : Father 4. Family history of Diabetes Mellitus (V18.0) : Brother 5. Family history of Family  Health Status Number Of Children   1 son and 1 daughter 47. Family history of Father Deceased At Age ____   58 /  Heart Failure 7. Family history of Heart Disease (V17.49) : Father 8. Family history of Mother Deceased At Age ____   12 / Brain  Tumor 9. Family history of Urologic Disorder (V18.7)   kidney stones  Social History Problems  1. Denied: History of Alcohol Use 2. Caffeine Use   1 per day 3. Marital History - Currently Married 4. Occupation:   retired Engineer, structural  Review of Systems Genitourinary, constitutional, skin, eye, otolaryngeal, hematologic/lymphatic, cardiovascular, pulmonary, endocrine, musculoskeletal, gastrointestinal, neurological and psychiatric system(s) were reviewed and pertinent findings if present are noted.  Genitourinary: urinary frequency, feelings of urinary urgency, nocturia, weak urinary stream, urinary stream starts and stops, incomplete emptying of bladder, erectile dysfunction, initiating urination requires straining and initiating urination requires straining.    Vitals Vital Signs [Data Includes: Last 1 Day]  Recorded: 20Mar2015 03:40PM  Blood Pressure: 118 / 82 Temperature: 98.2 F Heart Rate: 105  Physical Exam Constitutional: Well nourished and well developed . No acute distress.  ENT:. The ears and nose are normal in appearance.  Neck: The appearance of the neck is normal and no neck mass is present.  Pulmonary: No respiratory distress and normal respiratory rhythm and effort.  Cardiovascular: Heart rate and rhythm are normal . No peripheral edema.  Abdomen: The abdomen is soft and nontender. No masses are palpated. No CVA tenderness. No hernias are palpable. No hepatosplenomegaly noted.  Genitourinary: Examination of the penis demonstrates no discharge, no masses, no lesions and a normal meatus. The scrotum is without lesions. The right epididymis is palpably normal and non-tender. The left epididymis is palpably normal and non-tender. The right testis is non-tender and without masses. The left testis is non-tender and without masses.  Lymphatics: The femoral and inguinal nodes are not enlarged or tender.  Skin: Normal skin turgor, no visible rash and no visible skin  lesions.  Neuro/Psych:. Mood and affect are appropriate.    Results/Data Selected Results  TESTOSTERONE [id number] 07:59AM Carolan Clines  SPECIMEN TYPE: BLOOD   Test Name Result Flag Reference  TESTOSTERONE, TOTAL 196 ng/dL L 300-890  TANNER STAGE       MALE              MALE               I              < 30 NG/DL        < 10 NG/DL               II             < 150 NG/DL       < 30 NG/DL               III            100-320 NG/DL     < 35 NG/DL               IV             200-970 NG/DL     15-40 NG/DL               V/ADULT        300-890 NG/DL     10-70 NG/DL   PSA [id number] 07:52AM Roshawna Colclasure  SPECIMEN TYPE: BLOOD  Test Name Result Flag Reference  PSA 0.39 ng/mL  <=4.00  TEST METHODOLOGY: ECLIA PSA (ELECTROCHEMILUMINESCENCE IMMUNOASSAY)   Procedure    With the patient in the supine position, the right gluteal area is prepped with Betadine solution and draped in the usual fashion. 15 cc of Marcaine 0.25 plain was injected in 3 separate arms in the subcutaneous space. Using a 11 blade, a 1/2 cm incision is made, and through this incision, 4 separate arms of 5,5,3,2 Testopel pellets each are placed without difficulty. There was excessive bleeding noted due ASA usage. The wound is closed with multipe 4-0 Monocryl sutures. Sterile pressure dressing was applied, and ice was placed on the wound for 15 minutes.     Assessment Assessed  1. Hypogonadism, testicular (257.2) 2. Benign prostatic hyperplasia with urinary obstruction (600.01,599.69)  Stable post 15 pellet insertion.   Plan Erectile dysfunction due to arterial insufficiency  1. Follow-up Month x 1 Office  Follow-up  Status: Canceled - Date of Service 2. Follow-up Month x 4 Office  Follow-up for Testopel insertion  Status: Hold For - Date of  Service  Requested for: 07Aug2015 Hypogonadism, testicular  3. Testopel; Status:Hold For - Date of Service; Requested for:07Aug2015;  4. TESTOSTERONE; Status:Hold  For - Specimen/Data Collection; Requested  for:20Apr2015;  5. TESTOSTERONE; Status:Hold For - Specimen/Data Collection; Requested  for:31Aug2015;   T in 4 months.   Signatures Electronically signed by : Carolan Clines, M.D.; Jun 02 2013  5:20PM EST

## 2013-07-11 ENCOUNTER — Encounter (HOSPITAL_BASED_OUTPATIENT_CLINIC_OR_DEPARTMENT_OTHER): Payer: Self-pay | Admitting: Urology

## 2013-07-11 MED ORDER — OXYCODONE-ACETAMINOPHEN 5-325 MG PO TABS: 1.0000 | ORAL_TABLET | ORAL | Status: DC | PRN

## 2013-07-11 MED ORDER — TRIMETHOPRIM 100 MG PO TABS: 100.0000 mg | ORAL_TABLET | ORAL | Status: DC

## 2013-07-11 MED ORDER — URIBEL 118 MG PO CAPS: 1.0000 | ORAL_CAPSULE | Freq: Two times a day (BID) | ORAL | Status: DC | PRN

## 2013-07-11 NOTE — Discharge Instructions (Addendum)
Benign Prostatic Hyperplasia An enlarged prostate (benign prostatic hyperplasia) is common in older men. You may experience the following:  Weak urine stream.  Dribbling.  Feeling like the bladder has not emptied completely.  Difficulty starting urination.  Getting up frequently at night to urinate.  Urinating more frequently during the day. HOME CARE INSTRUCTIONS  Monitor your prostatic hyperplasia for any changes. The following actions may help to alleviate any discomfort you are experiencing:  Give yourself time when you urinate.  Stay away from alcohol.  Avoid beverages containing caffeine, such as coffee, tea, and colas, because they can make the problem worse.  Avoid decongestants, antihistamines, and some prescription medicines that can make the problem worse.  Follow up with your health care provider for further treatment as recommended. SEEK MEDICAL CARE IF:  You are experiencing progressive difficulty voiding.  Your urine stream is progressively getting narrower.  You are awaking from sleep with the urge to void more frequently.  You are constantly feeling the need to void.  You experience loss of urine, especially in small amounts. SEEK IMMEDIATE MEDICAL CARE IF:   You develop increased pain with urination or are unable to urinate.  You develop severe abdominal pain, vomiting, a high fever, or fainting.  You develop back pain or blood in your urine. MAKE SURE YOU:   Understand these instructions.  Will watch your condition.  Will get help right away if you are not doing well or get worse. Document Released: 03/02/2005 Document Revised: 11/02/2012 Document Reviewed: 08/02/2012 [facility] Patient Information 2014 [place].  Post Anesthesia Home Care Instructions  Activity: Get plenty of rest for the remainder of the day. A responsible adult should stay with you for 24 hours following the procedure.  For the next 24 hours, DO NOT: -Drive a  car -Paediatric nurse -Drink alcoholic beverages -Take any medication unless instructed by your physician -Make any legal decisions or sign important papers.  Meals: Start with liquid foods such as gelatin or soup. Progress to regular foods as tolerated. Avoid greasy, spicy, heavy foods. If nausea and/or vomiting occur, drink only clear liquids until the nausea and/or vomiting subsides. Call your physician if vomiting continues.  Special Instructions/Symptoms: Your throat may feel dry or sore from the anesthesia or the breathing tube placed in your throat during surgery. If this causes discomfort, gargle with warm salt water. The discomfort should disappear within 24 hours.

## 2013-07-11 NOTE — Discharge Summary (Signed)
Physician Discharge Summary  Patient ID: Tyler Wiggins MRN: [id number] DOB/AGE: 03/18/1950 63 y.o.  Admit date: 07/10/2013 Discharge date: 07/11/2013  Admission Diagnoses: bph  Discharge Diagnoses:  Active Problems:   Benign prostatic hyperplasia   Discharged Condition: stable  Hospital Course:   TURP 07/10/2013  Consults: none  Significant Diagnostic Studies: No results found.  Treatments:  none  Discharge Exam: Blood pressure 106/67, pulse 79, temperature 97.2 F (36.2 C), temperature source Oral, resp. rate 16, height 5\' 11"  (1.803 m), weight 114.76 kg (253 lb), SpO2 93.00%. ABD: benign. S-p soft.  Foley: draining clear urine.   Disposition:   Discharge Orders   Future Orders Complete By Expires   Discharge patient  As directed    Discontinue IV  As directed        Medication List         aspirin EC 325 MG tablet  Take 325 mg by mouth daily.     cyclobenzaprine 10 MG tablet  Commonly known as:  FLEXERIL  Take 10 mg by mouth 3 (three) times daily as needed for muscle spasms.     diclofenac sodium 1 % Gel  Commonly known as:  VOLTAREN  Apply 4 g topically 4 (four) times daily as needed.     fenofibrate 160 MG tablet  Take 160 mg by mouth every evening.     fish oil-omega-3 fatty acids 1000 MG capsule  Take 2 g by mouth daily.     irbesartan-hydrochlorothiazide 150-12.5 MG per tablet  Commonly known as:  AVALIDE  Take 1 tablet by mouth every morning.     omeprazole 20 MG capsule  Commonly known as:  PRILOSEC  Take 20 mg by mouth every evening.     oxyCODONE-acetaminophen 5-325 MG per tablet  Commonly known as:  ROXICET  Take 1 tablet by mouth every 4 (four) hours as needed for severe pain.     propranolol 20 MG tablet  Commonly known as:  INDERAL  Take 20 mg by mouth 2 (two) times daily as needed.     rosuvastatin 20 MG tablet  Commonly known as:  CRESTOR  Take 10 mg by mouth every evening.     tamsulosin 0.4 MG Caps capsule  Commonly  known as:  FLOMAX  Take 2 capsules (0.8 mg total) by mouth daily after supper.     trimethoprim 100 MG tablet  Commonly known as:  TRIMPEX  Take 1 tablet (100 mg total) by mouth 1 day or 1 dose.     URIBEL 118 MG Caps  Take 1 capsule (118 mg total) by mouth 3 times/day as needed-between meals & bedtime.     Vitamin D3 5000 UNITS Caps  Take 1 capsule by mouth daily.           Follow-up Information   Follow up with Batina Dougan I, MD In 1 day. (call Kim at [phone] for  instructions for time for office visit for catheter removal on 29, 2015)    Specialty:  Urology   Contact information:   [facility]  [place] [address] [phone]      Leave foley catheter in place for 24 hrs more, and RTC in AM for fill and pull foley catheter.  No flomwx needed.  Signed: Ailene Rud 07/11/2013, 9:05 AM

## 2013-07-11 NOTE — Progress Notes (Signed)
Pt's foley traction removed per order.

## 2014-10-09 ENCOUNTER — Other Ambulatory Visit: Payer: Self-pay | Admitting: Nurse Practitioner

## 2014-10-09 DIAGNOSIS — R591 Generalized enlarged lymph nodes: Secondary | ICD-10-CM

## 2014-10-10 ENCOUNTER — Ambulatory Visit
Admission: RE | Admit: 2014-10-10 | Discharge: 2014-10-10 | Disposition: A | Discharge status: Home / Self Care | Payer: Commercial Managed Care - HMO | Source: Ambulatory Visit | Attending: Nurse Practitioner | Admitting: Nurse Practitioner

## 2014-10-10 DIAGNOSIS — R591 Generalized enlarged lymph nodes: Secondary | ICD-10-CM

## 2014-11-15 DIAGNOSIS — Z7982 Long term (current) use of aspirin: Secondary | ICD-10-CM | POA: Diagnosis not present

## 2014-11-15 DIAGNOSIS — Z87891 Personal history of nicotine dependence: Secondary | ICD-10-CM | POA: Insufficient documentation

## 2014-11-15 DIAGNOSIS — K219 Gastro-esophageal reflux disease without esophagitis: Secondary | ICD-10-CM | POA: Insufficient documentation

## 2014-11-15 DIAGNOSIS — E785 Hyperlipidemia, unspecified: Secondary | ICD-10-CM | POA: Diagnosis not present

## 2014-11-15 DIAGNOSIS — G4733 Obstructive sleep apnea (adult) (pediatric): Secondary | ICD-10-CM | POA: Insufficient documentation

## 2014-11-15 DIAGNOSIS — Z79899 Other long term (current) drug therapy: Secondary | ICD-10-CM | POA: Diagnosis not present

## 2014-11-15 DIAGNOSIS — Z9981 Dependence on supplemental oxygen: Secondary | ICD-10-CM | POA: Insufficient documentation

## 2014-11-15 DIAGNOSIS — Z87438 Personal history of other diseases of male genital organs: Secondary | ICD-10-CM | POA: Insufficient documentation

## 2014-11-15 DIAGNOSIS — Z86010 Personal history of colonic polyps: Secondary | ICD-10-CM | POA: Insufficient documentation

## 2014-11-15 DIAGNOSIS — Z87448 Personal history of other diseases of urinary system: Secondary | ICD-10-CM | POA: Diagnosis not present

## 2014-11-15 DIAGNOSIS — I10 Essential (primary) hypertension: Secondary | ICD-10-CM | POA: Insufficient documentation

## 2014-11-15 DIAGNOSIS — K8020 Calculus of gallbladder without cholecystitis without obstruction: Secondary | ICD-10-CM | POA: Diagnosis not present

## 2014-11-15 DIAGNOSIS — R1013 Epigastric pain: Secondary | ICD-10-CM | POA: Diagnosis present

## 2014-11-16 ENCOUNTER — Encounter (HOSPITAL_COMMUNITY): Payer: Self-pay | Admitting: Emergency Medicine

## 2014-11-16 ENCOUNTER — Emergency Department (HOSPITAL_COMMUNITY)
Admission: EM | Admit: 2014-11-16 | Discharge: 2014-11-16 | Disposition: A | Discharge status: Home / Self Care | Payer: Commercial Managed Care - HMO | Attending: Emergency Medicine | Admitting: Emergency Medicine

## 2014-11-16 ENCOUNTER — Emergency Department (HOSPITAL_COMMUNITY): Payer: Commercial Managed Care - HMO

## 2014-11-16 DIAGNOSIS — K8020 Calculus of gallbladder without cholecystitis without obstruction: Secondary | ICD-10-CM

## 2014-11-16 DIAGNOSIS — R1010 Upper abdominal pain, unspecified: Secondary | ICD-10-CM

## 2014-11-16 LAB — COMPREHENSIVE METABOLIC PANEL
ALT: 41 U/L (ref 17–63)
AST: 30 U/L (ref 15–41)
Albumin: 4.1 g/dL (ref 3.5–5.0)
Alkaline Phosphatase: 63 U/L (ref 38–126)
Anion gap: 6 (ref 5–15)
BILIRUBIN TOTAL: 0.7 mg/dL (ref 0.3–1.2)
BUN: 16 mg/dL (ref 6–20)
CO2: 30 mmol/L (ref 22–32)
CREATININE: 1.3 mg/dL — AB (ref 0.61–1.24)
Calcium: 9.7 mg/dL (ref 8.9–10.3)
Chloride: 102 mmol/L (ref 101–111)
GFR calc Af Amer: >60 mL/min (ref >60)
GFR, EST NON AFRICAN AMERICAN: 57 mL/min — AB (ref >60)
GLUCOSE: 142 mg/dL — AB (ref 65–99)
Potassium: 4.1 mmol/L (ref 3.5–5.1)
Sodium: 138 mmol/L (ref 135–145)
Total Protein: 6.7 g/dL (ref 6.5–8.1)

## 2014-11-16 LAB — URINALYSIS, ROUTINE W REFLEX MICROSCOPIC
Bilirubin Urine: NEGATIVE
Glucose, UA: NEGATIVE mg/dL
Hgb urine dipstick: NEGATIVE
Ketones, ur: NEGATIVE mg/dL
Leukocytes, UA: NEGATIVE
Nitrite: NEGATIVE
Protein, ur: NEGATIVE mg/dL
Specific Gravity, Urine: 1.023 (ref 1.005–1.030)
Urobilinogen, UA: 1 mg/dL (ref 0.0–1.0)
pH: 6 (ref 5.0–8.0)

## 2014-11-16 LAB — CBC
HCT: 44.3 % (ref 39.0–52.0)
Hemoglobin: 15.7 g/dL (ref 13.0–17.0)
MCH: 31.3 pg (ref 26.0–34.0)
MCHC: 35.4 g/dL (ref 30.0–36.0)
MCV: 88.2 fL (ref 78.0–100.0)
Platelets: 208 K/uL (ref 150–400)
RBC: 5.02 MIL/uL (ref 4.22–5.81)
RDW: 14.1 % (ref 11.5–15.5)
WBC: 5.2 K/uL (ref 4.0–10.5)

## 2014-11-16 LAB — LIPASE, BLOOD: Lipase: 14 U/L — ABNORMAL LOW (ref 22–51)

## 2014-11-16 LAB — TROPONIN I

## 2014-11-16 MED ORDER — IOHEXOL 300 MG/ML  SOLN
25.0000 mL | Freq: Once | INTRAMUSCULAR | Status: AC | PRN
  Administered 2014-11-16: 25 mL via ORAL

## 2014-11-16 MED ORDER — FENTANYL CITRATE (PF) 100 MCG/2ML IJ SOLN
50.0000 ug | Freq: Once | INTRAMUSCULAR | Status: AC
  Administered 2014-11-16: 50 ug via INTRAVENOUS
  Filled 2014-11-16: qty 2

## 2014-11-16 MED ORDER — ONDANSETRON HCL 4 MG/2ML IJ SOLN
4.0000 mg | Freq: Once | INTRAMUSCULAR | Status: AC
  Administered 2014-11-16: 4 mg via INTRAVENOUS
  Filled 2014-11-16: qty 2

## 2014-11-16 MED ORDER — IOHEXOL 300 MG/ML  SOLN
100.0000 mL | Freq: Once | INTRAMUSCULAR | Status: AC | PRN
  Administered 2014-11-16: 100 mL via INTRAVENOUS

## 2014-11-16 MED ORDER — ONDANSETRON 8 MG PO TBDP: 8.0000 mg | ORAL_TABLET | Freq: Three times a day (TID) | ORAL | Status: DC | PRN

## 2014-11-16 MED ORDER — HYDROMORPHONE HCL 1 MG/ML IJ SOLN
1.0000 mg | INTRAMUSCULAR | Status: DC | PRN
  Administered 2014-11-16 (×2): 1 mg via INTRAVENOUS
  Filled 2014-11-16 (×2): qty 1

## 2014-11-16 MED ORDER — OXYCODONE-ACETAMINOPHEN 5-325 MG PO TABS: 1.0000 | ORAL_TABLET | ORAL | Status: DC | PRN

## 2014-11-16 NOTE — ED Notes (Signed)
Campos, MD at bedside.  
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
## 2014-11-16 NOTE — ED Notes (Addendum)
Pt. reports RUQ pain radiating to right upper back with nausea onset this evening , denies SOB, no vomitting .

## 2014-11-16 NOTE — Discharge Instructions (Signed)

## 2014-12-04 ENCOUNTER — Other Ambulatory Visit: Payer: Self-pay | Admitting: General Surgery

## 2014-12-24 ENCOUNTER — Encounter (HOSPITAL_COMMUNITY): Payer: Self-pay

## 2014-12-24 ENCOUNTER — Encounter (HOSPITAL_COMMUNITY)
Admission: RE | Admit: 2014-12-24 | Discharge: 2014-12-24 | Disposition: A | Discharge status: Home / Self Care | Payer: Commercial Managed Care - HMO | Source: Ambulatory Visit | Attending: General Surgery | Admitting: General Surgery

## 2014-12-24 DIAGNOSIS — E785 Hyperlipidemia, unspecified: Secondary | ICD-10-CM | POA: Diagnosis not present

## 2014-12-24 DIAGNOSIS — Z791 Long term (current) use of non-steroidal anti-inflammatories (NSAID): Secondary | ICD-10-CM | POA: Diagnosis not present

## 2014-12-24 DIAGNOSIS — K8010 Calculus of gallbladder with chronic cholecystitis without obstruction: Secondary | ICD-10-CM | POA: Diagnosis not present

## 2014-12-24 DIAGNOSIS — K219 Gastro-esophageal reflux disease without esophagitis: Secondary | ICD-10-CM | POA: Diagnosis not present

## 2014-12-24 DIAGNOSIS — G4733 Obstructive sleep apnea (adult) (pediatric): Secondary | ICD-10-CM | POA: Diagnosis not present

## 2014-12-24 DIAGNOSIS — E669 Obesity, unspecified: Secondary | ICD-10-CM | POA: Diagnosis not present

## 2014-12-24 DIAGNOSIS — I10 Essential (primary) hypertension: Secondary | ICD-10-CM | POA: Diagnosis not present

## 2014-12-24 DIAGNOSIS — Z79899 Other long term (current) drug therapy: Secondary | ICD-10-CM | POA: Diagnosis not present

## 2014-12-24 DIAGNOSIS — Z87891 Personal history of nicotine dependence: Secondary | ICD-10-CM | POA: Diagnosis not present

## 2014-12-24 DIAGNOSIS — M1990 Unspecified osteoarthritis, unspecified site: Secondary | ICD-10-CM | POA: Diagnosis not present

## 2014-12-24 DIAGNOSIS — Z6837 Body mass index (BMI) 37.0-37.9, adult: Secondary | ICD-10-CM | POA: Diagnosis not present

## 2014-12-24 HISTORY — DX: Personal history of urinary calculi: Z87.442

## 2014-12-24 LAB — CBC WITH DIFFERENTIAL/PLATELET
BASOS ABS: 0 10*3/uL (ref 0.0–0.1)
Basophils Relative: 1 %
EOS ABS: 0.1 10*3/uL (ref 0.0–0.7)
Eosinophils Relative: 3 %
HCT: 40.5 % (ref 39.0–52.0)
HEMOGLOBIN: 13.7 g/dL (ref 13.0–17.0)
LYMPHS ABS: 1.3 10*3/uL (ref 0.7–4.0)
Lymphocytes Relative: 29 %
MCH: 30.4 pg (ref 26.0–34.0)
MCHC: 33.8 g/dL (ref 30.0–36.0)
MCV: 89.8 fL (ref 78.0–100.0)
Monocytes Absolute: 0.6 10*3/uL (ref 0.1–1.0)
Monocytes Relative: 12 %
NEUTROS PCT: 55 %
Neutro Abs: 2.6 10*3/uL (ref 1.7–7.7)
Platelets: 178 10*3/uL (ref 150–400)
RBC: 4.51 MIL/uL (ref 4.22–5.81)
RDW: 13.8 % (ref 11.5–15.5)
WBC: 4.7 10*3/uL (ref 4.0–10.5)

## 2014-12-24 LAB — COMPREHENSIVE METABOLIC PANEL
ALBUMIN: 3.7 g/dL (ref 3.5–5.0)
ALK PHOS: 57 U/L (ref 38–126)
ALT: 45 U/L (ref 17–63)
AST: 27 U/L (ref 15–41)
Anion gap: 9 (ref 5–15)
BUN: 20 mg/dL (ref 6–20)
CALCIUM: 9.7 mg/dL (ref 8.9–10.3)
CHLORIDE: 101 mmol/L (ref 101–111)
CO2: 27 mmol/L (ref 22–32)
CREATININE: 1.21 mg/dL (ref 0.61–1.24)
GFR calc non Af Amer: >60 mL/min (ref >60)
GLUCOSE: 148 mg/dL — AB (ref 65–99)
Potassium: 4.3 mmol/L (ref 3.5–5.1)
SODIUM: 137 mmol/L (ref 135–145)
Total Bilirubin: 0.5 mg/dL (ref 0.3–1.2)
Total Protein: 6.5 g/dL (ref 6.5–8.1)

## 2014-12-24 NOTE — Pre-Procedure Instructions (Addendum)
HILMAR MOLDOVAN  12/24/2014      [facility] # [place], [place] [place] [address] Phone: [phone] Fax: [phone]    Your procedure is scheduled on 12/26/14.  Report to [facility] short stay admitting at 1030 A.M.  Call this number if you have problems the morning of surgery:  [phone]   Remember:  Do not eat food or drink liquids after midnight.  Take these medicines the morning of surgery with A SIP OF WATER prilosec, oxycodone if needed, inderal  STOP all herbel meds, nsaids (aleve,naproxen,advil,ibuprofen) TODAY including voltaren, aspirin, vitamins, fish oil,    Do not wear jewelry, make-up or nail polish.  Do not wear lotions, powders, or perfumes.  You may wear deodorant.  Do not shave 48 hours prior to surgery.  Men may shave face and neck.  Do not bring valuables to the hospital.  [facility] is not responsible for any belongings or valuables.  Contacts, dentures or bridgework may not be worn into surgery.  Leave your suitcase in the car.  After surgery it may be brought to your room.  For patients admitted to the hospital, discharge time will be determined by your treatment team.  Patients discharged the day of surgery will not be allowed to drive home.   Name and phone number of your driver:    Special instructions:  Special Instructions: [place] - Preparing for Surgery  Before surgery, you can play an important role.  Because skin is not sterile, your skin needs to be as free of germs as possible.  You can reduce the number of germs on you skin by washing with CHG (chlorahexidine gluconate) soap before surgery.  CHG is an antiseptic cleaner which kills germs and bonds with the skin to continue killing germs even after washing.  Please DO NOT use if you have an allergy to CHG or antibacterial soaps.  If your skin becomes reddened/irritated stop using the CHG and inform your nurse when you  arrive at Short Stay.  Do not shave (including legs and underarms) for at least 48 hours prior to the first CHG shower.  You may shave your face.  Please follow these instructions carefully:   1.  Shower with CHG Soap the night before surgery and the morning of Surgery.  2.  If you choose to wash your hair, wash your hair first as usual with your normal shampoo.  3.  After you shampoo, rinse your hair and body thoroughly to remove the Shampoo.  4.  Use CHG as you would any other liquid soap.  You can apply chg directly  to the skin and wash gently with scrungie or a clean washcloth.  5.  Apply the CHG Soap to your body ONLY FROM THE NECK DOWN.  Do not use on open wounds or open sores.  Avoid contact with your eyes ears, mouth and genitals (private parts).  Wash genitals (private parts)       with your normal soap.  6.  Wash thoroughly, paying special attention to the area where your surgery will be performed.  7.  Thoroughly rinse your body with warm water from the neck down.  8.  DO NOT shower/wash with your normal soap after using and rinsing off the CHG Soap.  9.  Pat yourself dry with a clean towel.            10.  Wear clean pajamas.  11.  Place clean sheets on your bed the night of your first shower and do not sleep with pets.  Day of Surgery  Do not apply any lotions/deodorants the morning of surgery.  Please wear clean clothes to the hospital/surgery center.  Please read over the following fact sheets that you were given. Pain Booklet, Coughing and Deep Breathing and Surgical Site Infection Prevention

## 2014-12-25 MED ORDER — DEXTROSE 5 % IV SOLN
3.0000 g | INTRAVENOUS | Status: AC
  Administered 2014-12-26: 3 g via INTRAVENOUS
  Filled 2014-12-25: qty 3000

## 2014-12-25 NOTE — H&P (Signed)
Tyler Wiggins Tyler Wiggins  Location: [facility] Patient #: [id number] DOB: November 10, 1950 Married / Language: English / Race: White Male       History of Present Illness    The patient is a 64 year old male who presents for evaluation of gall stones. This is a very pleasant 64 year old Caucasian male, referred by Dr. Jola Wiggins in the emergency department for evaluation and management of symptomatic gallstones. Dr. Chriss Wiggins polite is his PCP. He had his first ever episode of severe biliary colic on September 1 after eating a fish dinner. He developed epigastric distress and back pain which became progressively severe. He said it was worse than a kidney stone. He eventually went to the emergency department at midnight. The pain eventually subsided and he went home about 6 AM. All his lab work was normal. Cardiac workup was normal. CT scan showed multiple small gallstones but no inflammation, normal CBD. Small bilateral inguinal hernias containing fat only. Ultrasound confirmed multiple gallstones with a CBD measuring 5.6 mm. He has not had any trouble since then but feels slight discomfort in his epigastrium. He's been able to eat. No diarrhea. Past history is significant for BPH, with history of TURP by Dr. Minus Wiggins. Chronic GERD. Hypertension. Hyperlipidemia. Sleep apnea on CPAP. Degenerative disc disease. Family history reveals mother died age 53 of a brain tumor and dementia. Father died age 45 of heart failure and diabetes. He is retired. Married. His wife is with him throughout the encounter today. 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 Denies tobacco or alcohol. He wants to go ahead and schedule surgery. He will be scheduled for laparoscopic cholecystectomy with cholangiogram, possible open cholecystectomy. I discussed the indications, details, techniques, and numerous risk of the  surgery with him and his wife. They're aware of the risks of bleeding, infection, conversion to open laparotomy, injury to adjacent organs such as the main bile duct or intestine with major reconstructive surgery, wound hernia, bile leak, cardiac, pulmonary, and thromboembolic problems. He understands all of these issues and all his questions are answered. He agrees with this plan.   Other Problems  Back Pain Cholelithiasis Gastroesophageal Reflux Disease High blood pressure Hypercholesterolemia Kidney Stone Sleep Apnea  Past Surgical History Colon Polyp Removal - Colonoscopy Foot Surgery Bilateral. Knee Surgery Left. Spinal Surgery - Neck TURP Vasectomy  Diagnostic Studies History  Colonoscopy 1-5 years ago  Allergies  Valium *ANTIANXIETY AGENTS*  Medication History  Crestor (20MG  Tablet, Oral) Active. Cyclobenzaprine HCl (10MG  Tablet, Oral) Active. Fenofibrate (160MG  Tablet, Oral) Active. Irbesartan-Hydrochlorothiazide (150-12.5MG  Tablet, Oral) Active. Voltaren (1% Gel, Transdermal) Active. Propranolol HCl (20MG  Tablet, Oral) Active. Omeprazole (40MG  Capsule DR, Oral) Active. Ondansetron (8MG  Tablet Disperse, Oral) Active. Oxycodone-Acetaminophen (5-325MG  Tablet, Oral as needed) Active. Vitamin D3 (5000UNIT Tablet, Oral) Active. Medications Reconciled  Social History  Alcohol use Remotely quit alcohol use. Caffeine use Coffee, Tea. No drug use Tobacco use Former smoker.  Family History  Colon Cancer Father. Colon Polyps Brother, Father. Diabetes Mellitus Brother, Father. Heart Disease Father. Melanoma Brother.  Review of Systems  General Present- Night Sweats. Not Present- Appetite Loss, Chills, Fatigue, Fever, Weight Gain and Weight Loss. Skin Not Present- Change in Wart/Mole, Dryness, Hives, Jaundice, New Lesions, Non-Healing Wounds, Rash and Ulcer. HEENT Not Present- Earache, Hearing Loss, Hoarseness, Nose Bleed, Oral  Ulcers, Ringing in the Ears, Seasonal Allergies, Sinus Pain, Sore Throat, Visual Disturbances, Wears glasses/contact lenses and Yellow Eyes.  Respiratory Present- Snoring. Not Present- Bloody sputum, Chronic Cough, Difficulty Breathing and Wheezing. Breast Not Present- Breast Mass, Breast Pain, Nipple Discharge and Skin Changes. Cardiovascular Present- Leg Cramps. Not Present- Chest Pain, Difficulty Breathing Lying Down, Palpitations, Rapid Heart Rate, Shortness of Breath and Swelling of Extremities. Gastrointestinal Not Present- Abdominal Pain, Bloating, Bloody Stool, Change in Bowel Habits, Chronic diarrhea, Constipation, Difficulty Swallowing, Excessive gas, Gets full quickly at meals, Hemorrhoids, Indigestion, Nausea, Rectal Pain and Vomiting. Male Genitourinary Not Present- Blood in Urine, Change in Urinary Stream, Frequency, Impotence, Nocturia, Painful Urination, Urgency and Urine Leakage. Musculoskeletal Not Present- Back Pain, Joint Pain, Joint Stiffness, Muscle Pain, Muscle Weakness and Swelling of Extremities. Neurological Not Present- Decreased Memory, Fainting, Headaches, Numbness, Seizures, Tingling, Tremor, Trouble walking and Weakness. Psychiatric Not Present- Anxiety, Bipolar, Change in Sleep Pattern, Depression, Fearful and Frequent crying. Endocrine Not Present- Cold Intolerance, Excessive Hunger, Hair Changes, Heat Intolerance, Hot flashes and New Diabetes. Hematology Not Present- Easy Bruising, Excessive bleeding, Gland problems, HIV and Persistent Infections.   Vitals  Weight: 265.6 lb Height: 71in Body Surface Area: 2.46 m Body Mass Index: 37.04 kg/m Temp.: 97.75F(Temporal)  Pulse: 64 (Regular)  BP: 124/84 (Sitting, Left Arm, Standard)    Physical Exam  General Mental Status-Alert. General Appearance-Consistent with stated age. Hydration-Well hydrated. Voice-Normal. Note: BMI 37   Head and Neck Head-normocephalic, atraumatic with no  lesions or palpable masses. Trachea-midline. Thyroid Gland Characteristics - normal size and consistency.  Eye Eyeball - Bilateral-Extraocular movements intact. Sclera/Conjunctiva - Bilateral-No scleral icterus.  Chest and Lung Exam Chest and lung exam reveals -quiet, even and easy respiratory effort with no use of accessory muscles and on auscultation, normal breath sounds, no adventitious sounds and normal vocal resonance. Inspection Chest Wall - Normal. Back - normal.   Cardiovascular Cardiovascular examination reveals -normal heart sounds, regular rate and rhythm with no murmurs and normal pedal pulses bilaterally.  Abdomen Inspection Inspection of the abdomen reveals - No Hernias. Skin - Scar - no surgical scars. Palpation/Percussion Palpation and Percussion of the abdomen reveal - Soft, Non Tender, No Rebound tenderness, No Rigidity (guarding) and No hepatosplenomegaly. Auscultation Auscultation of the abdomen reveals - Bowel sounds normal. Note: Slight subjective epigastric discomfort to deep palpation. Otherwise negative abdominal exam   Male Genitourinary Note: I did not feel obvious inguinal hernias on supine exam. I did not stand him up.   Neurologic Neurologic evaluation reveals -alert and oriented x 3 with no impairment of recent or remote memory. Mental Status-Normal.  Musculoskeletal Normal Exam - Left-Upper Extremity Strength Normal and Lower Extremity Strength Normal. Normal Exam - Right-Upper Extremity Strength Normal and Lower Extremity Strength Normal.  Lymphatic Head & Neck  General Head & Neck Lymphatics: Bilateral - Description - Normal. Axillary  General Axillary Region: Bilateral - Description - Normal. Tenderness - Non Tender. Femoral & Inguinal  Generalized Femoral & Inguinal Lymphatics: Bilateral - Description - Normal. Tenderness - Non Tender.    Assessment & Plan  GALLSTONES (K80.20)   You are being scheduled  for surgery - Our schedulers will call you. Your recent episode of upper abdominal pain is very typical for a severe gallbladder attack. Your workup revealed numerous gallstones in YOUR gallbladder, but no apparent complications. Your lab work was normal You are at risk for further attacks of pain and complications in the future, but it is difficult to predict when this will happen. You are in no immediate danger, but elective cholecystectomy is advised to prevent complications You will be Scheduled  for laparoscopic cholecystectomy with cholangiogram, possible open cholecystectomy at [facility] in the near future We have discussed the techniques and risk of this surgery in detail Please read the written information that I have given you.   SLEEP APNEA IN ADULT (G47.33) Impression: CPAP at home  HYPERTENSION, BENIGN (I10)  BMI 37.0-37.9, ADULT (Z68.37)  CHRONIC GERD (K21.9)  HISTORY OF BPH (P80.998) Impression: Status post TURP, Dr. Sylvester Harder M. Dalbert Batman, M.D., [facility] General and Minimally invasive Surgery Breast and Colorectal Surgery Office:   [phone] Pager:   [phone]

## 2014-12-25 NOTE — Progress Notes (Signed)
Anesthesia Chart Review: Patient is a 64 year old male scheduled for cholecystectomy tomorrow by Dr. Dalbert Batman.  History includes obesity, former smoker, GERD, BPH s/p TURP, ED, HLD, HTN, essential tremor, OSA on CPAP, impaired fasting glucose, ACDF. PCP is Dr. Delfina Redwood. He is not followed routinely by cardiology, but had a stress test in 2010 due to abnormal EKG, and was seen again by Dr. Dorris Carnes in 2012 for a pre-job physical and no new testing ordered. She felt he was at low risk for a major cardiac event at that time.  Meds include ASA (on Hold), Flexeril, fish oil, Avalide, Prilosec, potassium, Inderal, Crestor.  11/16/14 EKG: SR, right axis deviation, low voltage, precordial leads. Cannot rule out inferior infarct (old). Overall, I think EKG is stable dating back to 02/27/09. He was referred for stress testing at that time which was normal.  03/05/09 Nuclear stress test ([facility]): Normal Myocardial Perfusion Study. No significant ischemia demonstrated. This is a low risk scan. Post Stress EF 83%.No significant wall motion abnormalities.   Preoperative labs noted.   If no acute changes then I anticipate that he can proceed as planned.  George Hugh [facility] Short Stay Center/Anesthesiology Phone [phone] 12/25/2014 4:18 PM

## 2014-12-26 ENCOUNTER — Ambulatory Visit (HOSPITAL_COMMUNITY): Payer: Commercial Managed Care - HMO | Admitting: Vascular Surgery

## 2014-12-26 ENCOUNTER — Encounter (HOSPITAL_COMMUNITY)
Admission: RE | Disposition: A | Discharge status: Home / Self Care | Payer: Self-pay | Source: Ambulatory Visit | Attending: General Surgery

## 2014-12-26 ENCOUNTER — Ambulatory Visit (HOSPITAL_COMMUNITY): Payer: Commercial Managed Care - HMO | Admitting: Anesthesiology

## 2014-12-26 ENCOUNTER — Ambulatory Visit (HOSPITAL_COMMUNITY)
Admission: RE | Admit: 2014-12-26 | Discharge: 2014-12-26 | Disposition: A | Discharge status: Home / Self Care | Payer: Commercial Managed Care - HMO | Source: Ambulatory Visit | Attending: General Surgery | Admitting: General Surgery

## 2014-12-26 DIAGNOSIS — E785 Hyperlipidemia, unspecified: Secondary | ICD-10-CM | POA: Insufficient documentation

## 2014-12-26 DIAGNOSIS — Z87891 Personal history of nicotine dependence: Secondary | ICD-10-CM | POA: Insufficient documentation

## 2014-12-26 DIAGNOSIS — G4733 Obstructive sleep apnea (adult) (pediatric): Secondary | ICD-10-CM | POA: Insufficient documentation

## 2014-12-26 DIAGNOSIS — E669 Obesity, unspecified: Secondary | ICD-10-CM | POA: Insufficient documentation

## 2014-12-26 DIAGNOSIS — Z6837 Body mass index (BMI) 37.0-37.9, adult: Secondary | ICD-10-CM | POA: Insufficient documentation

## 2014-12-26 DIAGNOSIS — K219 Gastro-esophageal reflux disease without esophagitis: Secondary | ICD-10-CM | POA: Insufficient documentation

## 2014-12-26 DIAGNOSIS — Z79899 Other long term (current) drug therapy: Secondary | ICD-10-CM | POA: Insufficient documentation

## 2014-12-26 DIAGNOSIS — Z791 Long term (current) use of non-steroidal anti-inflammatories (NSAID): Secondary | ICD-10-CM | POA: Insufficient documentation

## 2014-12-26 DIAGNOSIS — M1990 Unspecified osteoarthritis, unspecified site: Secondary | ICD-10-CM | POA: Insufficient documentation

## 2014-12-26 DIAGNOSIS — I10 Essential (primary) hypertension: Secondary | ICD-10-CM | POA: Insufficient documentation

## 2014-12-26 DIAGNOSIS — K8010 Calculus of gallbladder with chronic cholecystitis without obstruction: Secondary | ICD-10-CM | POA: Diagnosis not present

## 2014-12-26 HISTORY — PX: CHOLECYSTECTOMY: SHX55

## 2014-12-26 SURGERY — LAPAROSCOPIC CHOLECYSTECTOMY
Anesthesia: General | Site: Abdomen

## 2014-12-26 MED ORDER — HYDROMORPHONE HCL 1 MG/ML IJ SOLN
0.2500 mg | INTRAMUSCULAR | Status: DC | PRN
  Administered 2014-12-26 (×3): 0.5 mg via INTRAVENOUS

## 2014-12-26 MED ORDER — ROCURONIUM BROMIDE 100 MG/10ML IV SOLN
INTRAVENOUS | Status: DC | PRN
  Administered 2014-12-26: 50 mg via INTRAVENOUS

## 2014-12-26 MED ORDER — PROPOFOL 10 MG/ML IV BOLUS
INTRAVENOUS | Status: AC
  Filled 2014-12-26: qty 20

## 2014-12-26 MED ORDER — FENTANYL CITRATE (PF) 100 MCG/2ML IJ SOLN: 25.0000 ug | INTRAMUSCULAR | Status: DC | PRN

## 2014-12-26 MED ORDER — SODIUM CHLORIDE 0.9 % IR SOLN
Status: DC | PRN
  Administered 2014-12-26: 1000 mL

## 2014-12-26 MED ORDER — FENTANYL CITRATE (PF) 100 MCG/2ML IJ SOLN
INTRAMUSCULAR | Status: DC | PRN
Start: 2014-12-26 — End: 2014-12-26
  Administered 2014-12-26 (×2): 100 ug via INTRAVENOUS

## 2014-12-26 MED ORDER — LACTATED RINGERS IV SOLN
INTRAVENOUS | Status: DC | PRN
  Administered 2014-12-26 (×2): via INTRAVENOUS

## 2014-12-26 MED ORDER — FENTANYL CITRATE (PF) 250 MCG/5ML IJ SOLN
INTRAMUSCULAR | Status: AC
  Filled 2014-12-26: qty 5

## 2014-12-26 MED ORDER — HYDROMORPHONE HCL 1 MG/ML IJ SOLN
INTRAMUSCULAR | Status: AC
  Filled 2014-12-26: qty 1

## 2014-12-26 MED ORDER — ACETAMINOPHEN 650 MG RE SUPP
650.0000 mg | RECTAL | Status: DC | PRN
  Filled 2014-12-26: qty 1

## 2014-12-26 MED ORDER — 0.9 % SODIUM CHLORIDE (POUR BTL) OPTIME
TOPICAL | Status: DC | PRN
  Administered 2014-12-26: 1000 mL

## 2014-12-26 MED ORDER — MIDAZOLAM HCL 2 MG/2ML IJ SOLN
INTRAMUSCULAR | Status: AC
  Filled 2014-12-26: qty 4

## 2014-12-26 MED ORDER — SODIUM CHLORIDE 0.9 % IJ SOLN: 3.0000 mL | INTRAMUSCULAR | Status: DC | PRN

## 2014-12-26 MED ORDER — BUPIVACAINE-EPINEPHRINE 0.5% -1:200000 IJ SOLN
INTRAMUSCULAR | Status: DC | PRN
  Administered 2014-12-26: 11 mL

## 2014-12-26 MED ORDER — OXYCODONE HCL 5 MG PO TABS
5.0000 mg | ORAL_TABLET | ORAL | Status: DC | PRN
  Administered 2014-12-26: 10 mg via ORAL

## 2014-12-26 MED ORDER — SODIUM CHLORIDE 0.9 % IJ SOLN: 3.0000 mL | Freq: Two times a day (BID) | INTRAMUSCULAR | Status: DC

## 2014-12-26 MED ORDER — ONDANSETRON HCL 4 MG/2ML IJ SOLN
INTRAMUSCULAR | Status: DC | PRN
  Administered 2014-12-26: 4 mg via INTRAVENOUS

## 2014-12-26 MED ORDER — OXYCODONE HCL 5 MG/5ML PO SOLN: 5.0000 mg | Freq: Once | ORAL | Status: DC | PRN

## 2014-12-26 MED ORDER — LACTATED RINGERS IV SOLN
INTRAVENOUS | Status: DC
  Administered 2014-12-26: 50 mL/h via INTRAVENOUS

## 2014-12-26 MED ORDER — SODIUM CHLORIDE 0.9 % IV SOLN: 250.0000 mL | INTRAVENOUS | Status: DC | PRN

## 2014-12-26 MED ORDER — LIDOCAINE HCL (CARDIAC) 20 MG/ML IV SOLN
INTRAVENOUS | Status: DC | PRN
Start: 2014-12-26 — End: 2014-12-26
  Administered 2014-12-26: 100 mg via INTRAVENOUS

## 2014-12-26 MED ORDER — PHENYLEPHRINE HCL 10 MG/ML IJ SOLN
INTRAMUSCULAR | Status: DC | PRN
  Administered 2014-12-26: 120 ug via INTRAVENOUS
  Administered 2014-12-26: 80 ug via INTRAVENOUS
  Administered 2014-12-26: 120 ug via INTRAVENOUS

## 2014-12-26 MED ORDER — HYDROCODONE-ACETAMINOPHEN 5-325 MG PO TABS: 1.0000 | ORAL_TABLET | Freq: Four times a day (QID) | ORAL | Status: DC | PRN

## 2014-12-26 MED ORDER — HEMOSTATIC AGENTS (NO CHARGE) OPTIME
TOPICAL | Status: DC | PRN
  Administered 2014-12-26: 1 via TOPICAL

## 2014-12-26 MED ORDER — SODIUM CHLORIDE 0.9 % IV SOLN
INTRAVENOUS | Status: DC
Start: 2014-12-26 — End: 2014-12-26

## 2014-12-26 MED ORDER — SUGAMMADEX SODIUM 500 MG/5ML IV SOLN
INTRAVENOUS | Status: AC
  Filled 2014-12-26: qty 5

## 2014-12-26 MED ORDER — SUCCINYLCHOLINE CHLORIDE 20 MG/ML IJ SOLN
INTRAMUSCULAR | Status: DC | PRN
  Administered 2014-12-26: 120 mg via INTRAVENOUS

## 2014-12-26 MED ORDER — OXYCODONE HCL 5 MG PO TABS
ORAL_TABLET | ORAL | Status: AC
  Filled 2014-12-26: qty 2

## 2014-12-26 MED ORDER — PROPOFOL 10 MG/ML IV BOLUS
INTRAVENOUS | Status: DC | PRN
  Administered 2014-12-26: 200 mg via INTRAVENOUS

## 2014-12-26 MED ORDER — ACETAMINOPHEN 325 MG PO TABS
650.0000 mg | ORAL_TABLET | ORAL | Status: DC | PRN
  Filled 2014-12-26: qty 2

## 2014-12-26 MED ORDER — PROMETHAZINE HCL 25 MG/ML IJ SOLN: 6.2500 mg | INTRAMUSCULAR | Status: DC | PRN

## 2014-12-26 MED ORDER — OXYCODONE HCL 5 MG PO TABS: 5.0000 mg | ORAL_TABLET | Freq: Once | ORAL | Status: DC | PRN

## 2014-12-26 MED ORDER — SUGAMMADEX SODIUM 500 MG/5ML IV SOLN
INTRAVENOUS | Status: DC | PRN
  Administered 2014-12-26: 488 mg via INTRAVENOUS

## 2014-12-26 SURGICAL SUPPLY — 43 items
APPLIER CLIP ROT 10 11.4 M/L (STAPLE) ×4
BLADE SURG ROTATE 9660 (MISCELLANEOUS) IMPLANT
CANISTER SUCTION 2500CC (MISCELLANEOUS) ×4 IMPLANT
CHLORAPREP W/TINT 26ML (MISCELLANEOUS) ×4 IMPLANT
CLIP APPLIE ROT 10 11.4 M/L (STAPLE) ×2 IMPLANT
COVER MAYO STAND STRL (DRAPES) ×4 IMPLANT
COVER SURGICAL LIGHT HANDLE (MISCELLANEOUS) ×4 IMPLANT
DERMABOND ADVANCED (GAUZE/BANDAGES/DRESSINGS) ×2
DERMABOND ADVANCED .7 DNX12 (GAUZE/BANDAGES/DRESSINGS) ×2 IMPLANT
DRAPE C-ARM 42X72 X-RAY (DRAPES) ×4 IMPLANT
ELECT REM PT RETURN 9FT ADLT (ELECTROSURGICAL) ×4
ELECTRODE REM PT RTRN 9FT ADLT (ELECTROSURGICAL) ×2 IMPLANT
GLOVE BIO SURGEON STRL SZ7 (GLOVE) ×4 IMPLANT
GLOVE BIOGEL PI IND STRL 7.0 (GLOVE) ×2 IMPLANT
GLOVE BIOGEL PI IND STRL 7.5 (GLOVE) ×4 IMPLANT
GLOVE BIOGEL PI INDICATOR 7.0 (GLOVE) ×2
GLOVE BIOGEL PI INDICATOR 7.5 (GLOVE) ×4
GLOVE ECLIPSE 7.5 STRL STRAW (GLOVE) ×8 IMPLANT
GLOVE EUDERMIC 7 POWDERFREE (GLOVE) ×4 IMPLANT
GOWN STRL REUS W/ TWL LRG LVL3 (GOWN DISPOSABLE) ×6 IMPLANT
GOWN STRL REUS W/ TWL XL LVL3 (GOWN DISPOSABLE) ×2 IMPLANT
GOWN STRL REUS W/TWL LRG LVL3 (GOWN DISPOSABLE) ×6
GOWN STRL REUS W/TWL XL LVL3 (GOWN DISPOSABLE) ×2
HEMOSTAT SNOW SURGICEL 2X4 (HEMOSTASIS) ×4 IMPLANT
KIT BASIN OR (CUSTOM PROCEDURE TRAY) ×4 IMPLANT
KIT ROOM TURNOVER OR (KITS) ×4 IMPLANT
NS IRRIG 1000ML POUR BTL (IV SOLUTION) ×4 IMPLANT
PAD ARMBOARD 7.5X6 YLW CONV (MISCELLANEOUS) ×4 IMPLANT
POUCH SPECIMEN RETRIEVAL 10MM (ENDOMECHANICALS) ×4 IMPLANT
SCISSORS LAP 5X35 DISP (ENDOMECHANICALS) ×4 IMPLANT
SET CHOLANGIOGRAPH 5 50 .035 (SET/KITS/TRAYS/PACK) ×4 IMPLANT
SET IRRIG TUBING LAPAROSCOPIC (IRRIGATION / IRRIGATOR) ×4 IMPLANT
SLEEVE ENDOPATH XCEL 5M (ENDOMECHANICALS) ×4 IMPLANT
SPECIMEN JAR SMALL (MISCELLANEOUS) ×4 IMPLANT
SUT MNCRL AB 4-0 PS2 18 (SUTURE) ×8 IMPLANT
SUT VICRYL 0 UR6 27IN ABS (SUTURE) ×4 IMPLANT
TOWEL OR 17X24 6PK STRL BLUE (TOWEL DISPOSABLE) ×4 IMPLANT
TOWEL OR 17X26 10 PK STRL BLUE (TOWEL DISPOSABLE) ×4 IMPLANT
TRAY LAPAROSCOPIC MC (CUSTOM PROCEDURE TRAY) ×4 IMPLANT
TROCAR XCEL BLUNT TIP 100MML (ENDOMECHANICALS) ×4 IMPLANT
TROCAR XCEL NON-BLD 11X100MML (ENDOMECHANICALS) ×4 IMPLANT
TROCAR XCEL NON-BLD 5MMX100MML (ENDOMECHANICALS) ×4 IMPLANT
TUBING INSUFFLATION (TUBING) ×4 IMPLANT

## 2014-12-26 NOTE — Interval H&P Note (Signed)
History and Physical Interval Note:  12/26/2014 10:55 AM  Tyler Wiggins  has presented today for surgery, with the diagnosis of CHOLELITHIASIS  The various methods of treatment have been discussed with the patient and family. After consideration of risks, benefits and other options for treatment, the patient has consented to  Procedure(s): LAPAROSCOPIC CHOLECYSTECTOMY WITH INTRAOPERATIVE CHOLANGIOGRAM POSSIBLE OPEN (N/A) as a surgical intervention .  The patient's history has been reviewed, patient examined, no change in status, stable for surgery.  I have reviewed the patient's chart and labs.  Questions were answered to the patient's satisfaction.     [name]

## 2014-12-26 NOTE — Anesthesia Procedure Notes (Addendum)
Procedure Name: Intubation Date/Time: 12/26/2014 11:44 AM Performed by: Neldon Newport Pre-anesthesia Checklist: Patient being monitored, Suction available, Emergency Drugs available, Patient identified and Timeout performed Patient Re-evaluated:Patient Re-evaluated prior to inductionOxygen Delivery Method: Circle system utilized Preoxygenation: Pre-oxygenation with 100% oxygen Intubation Type: IV induction and Rapid sequence Ventilation: Two handed mask ventilation required Laryngoscope Size: Mac, 3 and Glidescope Grade View: Grade III Tube type: Oral Tube size: 7.5 mm Number of attempts: 2 Placement Confirmation: positive ETCO2,  ETT inserted through vocal cords under direct vision and breath sounds checked- equal and bilateral Secured at: 23 cm Tube secured with: Tape Dental Injury: Teeth and Oropharynx as per pre-operative assessment

## 2014-12-26 NOTE — Transfer of Care (Signed)
Immediate Anesthesia Transfer of Care Note  Patient: Tyler Wiggins  Procedure(s) Performed: Procedure(s): LAPAROSCOPIC CHOLECYSTECTOMY (N/A)  Patient Location: PACU  Anesthesia Type:General  Level of Consciousness: patient cooperative and lethargic  Airway & Oxygen Therapy: Patient connected to face mask oxygen  Post-op Assessment: Report given to RN, Post -op Vital signs reviewed and stable and Patient moving all extremities X 4  Post vital signs: Reviewed and stable  Last Vitals:  Filed Vitals:   12/26/14 1101  BP: 116/67  Pulse: 72  Temp: 37 C  Resp: 20    Complications: No apparent anesthesia complications

## 2014-12-26 NOTE — Op Note (Addendum)
Patient Name:           Tyler Wiggins   Date of Surgery:        12/26/2014  Pre op Diagnosis:      Chronic cholecystitis with cholelithiasis  Post op Diagnosis:    same  Procedure:                 Laparoscopic cholecystectomy  Surgeon:                     Edsel Petrin. Dalbert Batman, M.D., FACS  Assistant:                      OR staff  Operative Indications:   . This is a very pleasant 64 year old Caucasian male, referred by Dr. Jola Schmidt in the emergency department for evaluation and management of symptomatic gallstones. Dr. Chriss Czar polite is his PCP. He had his first ever episode of severe biliary colic on September 1 after eating a fish dinner. He developed epigastric distress and back pain which became progressively severe. He said it was worse than a kidney stone. He eventually went to the emergency department at midnight. The pain eventually subsided and he went home about 6 AM. All his lab work was normal. Cardiac workup was normal. CT scan showed multiple small gallstones but no inflammation, normal CBD. Small bilateral inguinal hernias containing fat only. Ultrasound confirmed multiple gallstones with a CBD measuring 5.6 mm. He has not had any trouble since then but feels slight discomfort in his epigastrium. He's been able to eat. No diarrhea.  He wants to go ahead and schedule surgery. He is brought to the operating room electively for cholecystectomy  Operative Findings:       The gallbladder was chronically inflamed, thick walls, friable, and bled easily.  The anatomy of the cystic duct and cystic artery were conventional.  I was able to create a large window and critical view behind the cystic duct.  The omentum was very large and very heavy.  The liver, stomach, duodenum, small intestine, and large intestine were grossly normal to inspection.  I had a little bit of bleeding from the omentum early in the case but that was controlled and did not seem to  persist.  Procedure in Detail:      Following the induction of general endotracheal anesthesia the patient's abdomen was prepped and draped in sterile fashion, timeout was performed and antibiotics were given.  0.5% Marcaine with epinephrine was used as a local infiltration anesthetic.      A vertical incision was made at the superior rim of the umbilicus.  The fascia was incised in the midline and the abdominal cavity entered under direct vision.  An 11 mm Hassan trocar was inserted and secured with the Purstring suture of 0 Vicryl.  Pneumoperitoneum was created and video camera was inserted.  Trocar was placed in subxiphoid region and two 5 mm trochars placed in the right upper quadrant.         We position the patient and used an angled scope for better visualization.  We elevated the fundus.  We slowly dissected lots of adhesions and fatty tissue off of the lower body and infundibulum of the gallbladder.  I was ultimately was able to identify the cystic duct and was able to get around that and the tissues were soft I was able to create a very large window.  I secured the cystic duct with numerous clips  and divided it.  I found the cystic artery as it went on to the wall of the gallbladder posteriorly.  This was secured with metal clips and divided.  I dissected the gallbladder from its bed with electrocautery, placed in a specimen bag and removed it from the field.  The operative field was copiously irrigated.  The liver bed was a little bit raw and so I placed a piece of Snow hemostatic sponge.  After 5 minutes things seemed quite hemostatic.  There was no bile leak.      The trochars were removed and the pneumoperitoneum was released.  The fascia at the umbilicus was closed with a figure-of-eight suture of 0 Vicryl and a Purstring suture of 0 Vicryl.  Skin incisions were closed with subcuticular sutures of 4-0 Monocryl and Dermabond.  The patient tolerated the procedure well was taken to PACU in stable  condition.  EBL 25 mL.  Counts correct.  Complications none.     Edsel Petrin. Dalbert Batman, M.D., FACS General and Minimally Invasive Surgery Breast and Colorectal Surgery  12/26/2014 12:48 PM

## 2014-12-26 NOTE — Discharge Instructions (Signed)
[facility] ______[facility] °LAPAROSCOPIC SURGERY: POST OP INSTRUCTIONS °Always review your discharge instruction sheet given to you by the facility where your surgery was performed. °IF YOU HAVE DISABILITY OR FAMILY LEAVE FORMS, YOU MUST BRING THEM TO THE OFFICE FOR PROCESSING.   °DO NOT GIVE THEM TO YOUR DOCTOR. ° °1. A prescription for pain medication may be given to you upon discharge.  Take your pain medication as prescribed, if needed.  If narcotic pain medicine is not needed, then you may take acetaminophen (Tylenol) or ibuprofen (Advil) as needed. °2. Take your usually prescribed medications unless otherwise directed. °3. If you need a refill on your pain medication, please contact your pharmacy.  They will contact our office to request authorization. Prescriptions will not be filled after 5pm or on week-ends. °4. You should follow a light diet the first few days after arrival home, such as soup and crackers, etc.  Be sure to include lots of fluids daily. °5. Most patients will experience some swelling and bruising in the area of the incisions.  Ice packs will help.  Swelling and bruising can take several days to resolve.  °6. It is common to experience some constipation if taking pain medication after surgery.  Increasing fluid intake and taking a stool softener (such as Colace) will usually help or prevent this problem from occurring.  A mild laxative (Milk of Magnesia or Miralax) should be taken according to package instructions if there are no bowel movements after 48 hours. °7. Unless discharge instructions indicate otherwise, you may remove your bandages 24-48 hours after surgery, and you may shower at that time.  You may have steri-strips (small skin tapes) in place directly over the incision.  These strips should be left on the skin for 7-10 days.  If your surgeon used skin glue on the incision, you may shower in 24 hours.  The glue will flake off over the next 2-3 weeks.  Any sutures or  staples will be removed at the office during your follow-up visit. °8. ACTIVITIES:  You may resume regular (light) daily activities beginning the next day--such as daily self-care, walking, climbing stairs--gradually increasing activities as tolerated.  You may have sexual intercourse when it is comfortable.  Refrain from any heavy lifting or straining until approved by your doctor. °a. You may drive when you are no longer taking prescription pain medication, you can comfortably wear a seatbelt, and you can safely maneuver your car and apply brakes. °b. RETURN TO WORK:  __________________________________________________________ °9. You should see your doctor in the office for a follow-up appointment approximately 2-3 weeks after your surgery.  Make sure that you call for this appointment within a day or two after you arrive home to insure a convenient appointment time. °10. OTHER INSTRUCTIONS: __________________________________________________________________________________________________________________________ __________________________________________________________________________________________________________________________ °WHEN TO CALL YOUR DOCTOR: °1. Fever over 101.0 °2. Inability to urinate °3. Continued bleeding from incision. °4. Increased pain, redness, or drainage from the incision. °5. Increasing abdominal pain ° °The clinic staff is available to answer your questions during regular business hours.  Please don’t hesitate to call and ask to speak to one of the nurses for clinical concerns.  If you have a medical emergency, go to the nearest emergency room or call 911.  A surgeon from [facility] is always on call at the hospital. °[address], [address], [place], [place]  [address] ? [address] [address], [place],    [address] °[phone] ? [phone] ? FAX [phone] °Web site:   [email or web address] °

## 2014-12-26 NOTE — Anesthesia Preprocedure Evaluation (Addendum)
Anesthesia Evaluation  Patient identified by MRN, date of birth, ID band Patient awake    Reviewed: Allergy & Precautions, NPO status , Patient's Chart, lab work & pertinent test results  Airway Mallampati: III  TM Distance: <3 FB Neck ROM: Full    Dental  (+) Dental Advisory Given   Pulmonary sleep apnea and Continuous Positive Airway Pressure Ventilation , former smoker,    breath sounds clear to auscultation       Cardiovascular hypertension, Pt. on medications  Rhythm:Regular Rate:Normal     Neuro/Psych negative neurological ROS     GI/Hepatic Neg liver ROS, GERD  ,  Endo/Other  negative endocrine ROS  Renal/GU negative Renal ROS     Musculoskeletal  (+) Arthritis ,   Abdominal   Peds  Hematology negative hematology ROS (+)   Anesthesia Other Findings   Reproductive/Obstetrics                            Anesthesia Physical Anesthesia Plan  ASA: II  Anesthesia Plan: General   Post-op Pain Management:    Induction: Intravenous  Airway Management Planned: Oral ETT  Additional Equipment:   Intra-op Plan:   Post-operative Plan: Extubation in OR  Informed Consent: I have reviewed the patients History and Physical, chart, labs and discussed the procedure including the risks, benefits and alternatives for the proposed anesthesia with the patient or authorized representative who has indicated his/her understanding and acceptance.   Dental advisory given  Plan Discussed with: CRNA  Anesthesia Plan Comments:         Anesthesia Quick Evaluation

## 2014-12-26 NOTE — Anesthesia Postprocedure Evaluation (Signed)
  Anesthesia Post-op Note  Patient: Tyler Wiggins  Procedure(s) Performed: Procedure(s): LAPAROSCOPIC CHOLECYSTECTOMY (N/A)  Patient Location: PACU  Anesthesia Type:General  Level of Consciousness: awake and alert   Airway and Oxygen Therapy: Patient Spontanous Breathing  Post-op Pain: mild  Post-op Assessment: Post-op Vital signs reviewed              Post-op Vital Signs: Reviewed  Last Vitals:  Filed Vitals:   12/26/14 1315  BP: 114/66  Pulse: 70  Temp:   Resp: 17    Complications: No apparent anesthesia complications

## 2014-12-27 ENCOUNTER — Encounter (HOSPITAL_COMMUNITY): Payer: Self-pay | Admitting: General Surgery

## 2015-10-07 ENCOUNTER — Encounter: Payer: Self-pay | Admitting: Gastroenterology

## 2015-10-16 ENCOUNTER — Encounter: Payer: Self-pay | Admitting: Gastroenterology

## 2015-11-21 ENCOUNTER — Encounter (INDEPENDENT_AMBULATORY_CARE_PROVIDER_SITE_OTHER): Payer: Self-pay

## 2015-11-21 ENCOUNTER — Ambulatory Visit (AMBULATORY_SURGERY_CENTER): Payer: Commercial Managed Care - HMO | Admitting: *Deleted

## 2015-11-21 VITALS — Ht 71.0 in | Wt 258.0 lb

## 2015-11-21 DIAGNOSIS — Z800 Family history of malignant neoplasm of digestive organs: Secondary | ICD-10-CM

## 2015-11-21 DIAGNOSIS — Z86010 Personal history of colonic polyps: Secondary | ICD-10-CM

## 2015-11-21 MED ORDER — NA SULFATE-K SULFATE-MG SULF 17.5-3.13-1.6 GM/177ML PO SOLN: 1.0000 | Freq: Once | ORAL | 0 refills | Status: AC

## 2015-11-21 NOTE — Progress Notes (Signed)
No egg or soy allergy known to patient  No issues with past sedation with any surgeries  or procedures, no intubation problems  No diet pills per patient No home 02 use per patient  No blood thinners per patient  Pt denies issues with constipation  No A fib or A flutter   

## 2015-12-03 ENCOUNTER — Encounter: Payer: Self-pay | Admitting: Gastroenterology

## 2015-12-10 ENCOUNTER — Encounter: Payer: Self-pay | Admitting: Gastroenterology

## 2015-12-10 ENCOUNTER — Ambulatory Visit (AMBULATORY_SURGERY_CENTER): Payer: Commercial Managed Care - HMO | Admitting: Gastroenterology

## 2015-12-10 VITALS — BP 91/56 | HR 63 | Temp 97.8°F | Resp 25 | Ht 71.0 in | Wt 258.0 lb

## 2015-12-10 DIAGNOSIS — Z86010 Personal history of colonic polyps: Secondary | ICD-10-CM

## 2015-12-10 DIAGNOSIS — Z800 Family history of malignant neoplasm of digestive organs: Secondary | ICD-10-CM | POA: Diagnosis not present

## 2015-12-10 DIAGNOSIS — D124 Benign neoplasm of descending colon: Secondary | ICD-10-CM

## 2015-12-10 DIAGNOSIS — D122 Benign neoplasm of ascending colon: Secondary | ICD-10-CM | POA: Diagnosis not present

## 2015-12-10 LAB — GLUCOSE, CAPILLARY
GLUCOSE-CAPILLARY: 90 mg/dL (ref 65–99)
Glucose-Capillary: 85 mg/dL (ref 65–99)

## 2015-12-10 MED ORDER — SODIUM CHLORIDE 0.9 % IV SOLN: 500.0000 mL | INTRAVENOUS | Status: AC

## 2015-12-10 NOTE — Progress Notes (Signed)
To recovery, report to [place], RN, VSS 

## 2015-12-10 NOTE — Op Note (Signed)
[facility] Patient Name: Tyler Wiggins Procedure Date: 12/10/2015 8:08 AM MRN: [id number] Endoscopist: Milus Banister , MD Age: 65 Referring MD:  Date of Birth: 24-Nov-1950 Gender: Male Account #: [id number] Procedure:                Colonoscopy Indications:              High risk colon cancer surveillance: Personal                            history of colonic polyps "large" adenomatous polyp                            removed by Dr. Lajoyce Corners in 2007, piecemeal; repeat                            colonoscopy 2008 no poylps. Colonoscopy Dr. Ardis Hughs                            2014 five small polyps, 2-3 were adenomas. Also                            father had colon cancer Medicines:                Monitored Anesthesia Care Procedure:                Pre-Anesthesia Assessment:                           - Prior to the procedure, a History and Physical                            was performed, and patient medications and                            allergies were reviewed. The patient's tolerance of                            previous anesthesia was also reviewed. The risks                            and benefits of the procedure and the sedation                            options and risks were discussed with the patient.                            All questions were answered, and informed consent                            was obtained. Prior Anticoagulants: The patient has                            taken no previous anticoagulant or antiplatelet  agents. ASA Grade Assessment: II - A patient with                            mild systemic disease. After reviewing the risks                            and benefits, the patient was deemed in                            satisfactory condition to undergo the procedure.                           After obtaining informed consent, the colonoscope                            was passed under direct vision. Throughout  the                            procedure, the patient's blood pressure, pulse, and                            oxygen saturations were monitored continuously. The                            Model CF-HQ190L [id number]) scope was introduced                            through the anus and advanced to the the cecum,                            identified by appendiceal orifice and ileocecal                            valve. The colonoscopy was performed without                            difficulty. The patient tolerated the procedure                            well. The quality of the bowel preparation was                            excellent. The ileocecal valve, appendiceal                            orifice, and rectum were photographed. Scope In: 8:21:06 AM Scope Out: 8:37:46 AM Scope Withdrawal Time: 0 hours 13 minutes 16 seconds  Total Procedure Duration: 0 hours 16 minutes 40 seconds  Findings:                 Two sessile polyps were found in the descending                            colon and ascending colon. The polyps were 2 to  4                            mm in size. These polyps were removed with a cold                            snare. Resection and retrieval were complete.                           The exam was otherwise without abnormality on                            direct and retroflexion views. Complications:            No immediate complications. Estimated blood loss:                            None. Estimated Blood Loss:     Estimated blood loss: none. Impression:               - Two 2 to 4 mm polyps in the descending colon and                            in the ascending colon, removed with a cold snare.                            Resected and retrieved.                           - The examination was otherwise normal on direct                            and retroflexion views. Recommendation:           - Patient has a contact number available for                             emergencies. The signs and symptoms of potential                            delayed complications were discussed with the                            patient. Return to normal activities tomorrow.                            Written discharge instructions were provided to the                            patient.                           - Resume previous diet.                           - Continue present medications.  You will receive a letter within 2-3 weeks with the                            pathology results and my final recommendations.                           If the polyp(s) is proven to be 'pre-cancerous' on                            pathology, you will need repeat colonoscopy in 5                            years. Milus Banister, MD 12/10/2015 8:42:39 AM This report has been signed electronically.

## 2015-12-10 NOTE — Patient Instructions (Signed)
YOU HAD AN ENDOSCOPIC PROCEDURE TODAY AT [facility]:   Refer to the procedure report that was given to you for any specific questions about what was found during the examination.  If the procedure report does not answer your questions, please call your gastroenterologist to clarify.  If you requested that your care partner not be given the details of your procedure findings, then the procedure report has been included in a sealed envelope for you to review at your convenience later.  YOU SHOULD EXPECT: Some feelings of bloating in the abdomen. Passage of more gas than usual.  Walking can help get rid of the air that was put into your GI tract during the procedure and reduce the bloating. If you had a lower endoscopy (such as a colonoscopy or flexible sigmoidoscopy) you may notice spotting of blood in your stool or on the toilet paper. If you underwent a bowel prep for your procedure, you may not have a normal bowel movement for a few days.  Please Note:  You might notice some irritation and congestion in your nose or some drainage.  This is from the oxygen used during your procedure.  There is no need for concern and it should clear up in a day or so.  SYMPTOMS TO REPORT IMMEDIATELY:   Following lower endoscopy (colonoscopy or flexible sigmoidoscopy):  Excessive amounts of blood in the stool  Significant tenderness or worsening of abdominal pains  Swelling of the abdomen that is new, acute  Fever of 100F or higher  For urgent or emergent issues, a gastroenterologist can be reached at any hour by calling [phone].   DIET:  We do recommend a small meal at first, but then you may proceed to your regular diet.  Drink plenty of fluids but you should avoid alcoholic beverages for 24 hours.  ACTIVITY:  You should plan to take it easy for the rest of today and you should NOT DRIVE or use heavy machinery until tomorrow (because of the sedation medicines used during the test).     FOLLOW UP: Our staff will call the number listed on your records the next business day following your procedure to check on you and address any questions or concerns that you may have regarding the information given to you following your procedure. If we do not reach you, we will leave a message.  However, if you are feeling well and you are not experiencing any problems, there is no need to return our call.  We will assume that you have returned to your regular daily activities without incident.  If any biopsies were taken you will be contacted by phone or by letter within the next 1-3 weeks.  Please call us at [phone] if you have not heard about the biopsies in 3 weeks.    SIGNATURES/CONFIDENTIALITY: You and/or your care partner have signed paperwork which will be entered into your electronic medical record.  These signatures attest to the fact that that the information above on your After Visit Summary has been reviewed and is understood.  Full responsibility of the confidentiality of this discharge information lies with you and/or your care-partner.  Await pathology  Please read over handout about polyps  Please continue your normal medicaitons

## 2015-12-10 NOTE — Progress Notes (Signed)
Called to room to assist during endoscopic procedure.  Patient ID and intended procedure confirmed with present staff. Received instructions for my participation in the procedure from the performing physician.  

## 2015-12-11 ENCOUNTER — Telehealth: Payer: Self-pay | Admitting: *Deleted

## 2015-12-11 NOTE — Telephone Encounter (Signed)
  Follow up Call-  Call back number 12/10/2015  Post procedure Call Back phone  # [phone]  Permission to leave phone message Yes  Some recent data might be hidden     Patient questions:  Do you have a fever, pain , or abdominal swelling? No. Pain Score  0 *  Have you tolerated food without any problems? Yes.    Have you been able to return to your normal activities? Yes.    Do you have any questions about your discharge instructions: Diet   No. Medications  No. Follow up visit  No.  Do you have questions or concerns about your Care? No.  Actions: * If pain score is 4 or above: No action needed, pain <4.

## 2015-12-16 ENCOUNTER — Encounter: Payer: Self-pay | Admitting: Gastroenterology

## 2017-05-27 IMAGING — CT CT ABD-PELV W/ CM
2 of 5 series · 16 of 46 positions shown, 18 images · IV contrast (APPLIED)
Comparison: 08/06/2006

CLINICAL DATA: Severe upper epigastric pain, onset yesterday
evening. Nausea.

EXAM:
CT ABDOMEN AND PELVIS WITH CONTRAST
TECHNIQUE: Multidetector CT imaging of the abdomen and pelvis was performed
using the standard protocol following bolus administration of
intravenous contrast.
CONTRAST:  100mL OMNIPAQUE IOHEXOL 300 MG/ML  SOLN

[Series 2: abd/ pelvis 5.0 i30f 1 · axial · 0.97mm/px · z∈[+848,+1368]mm · 13 of 118 slices shown, 15 images]
[im 7/118  soft-tissue]
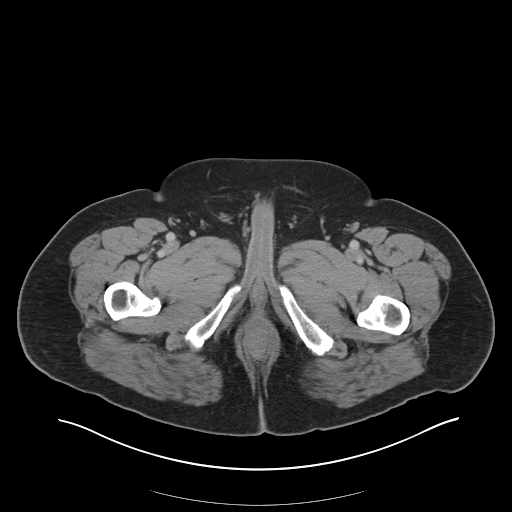
[im 7/118  bone]
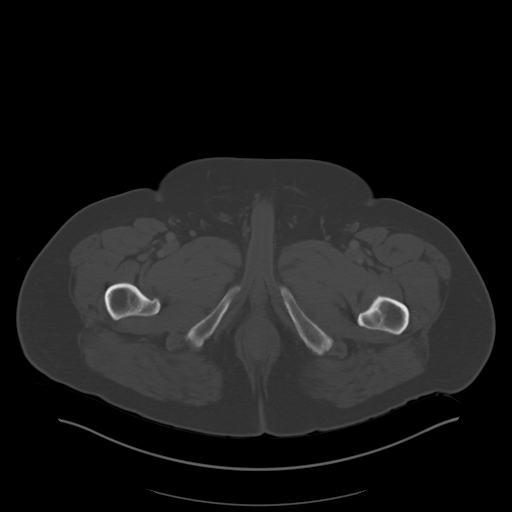
[im 19/118  soft-tissue]
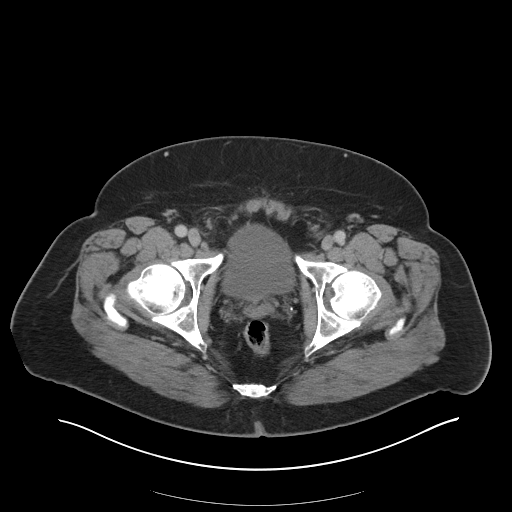
[im 25/118  soft-tissue]
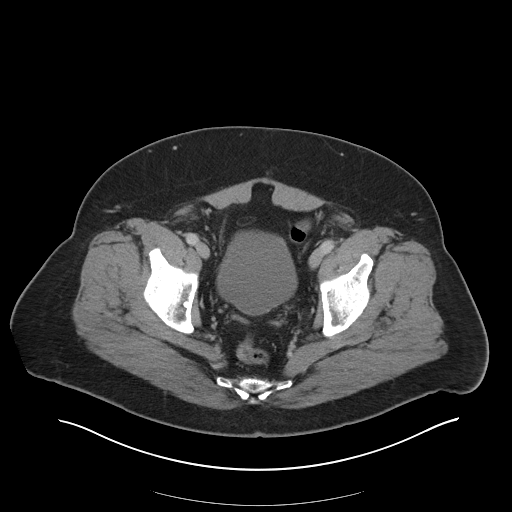
[im 31/118  soft-tissue]
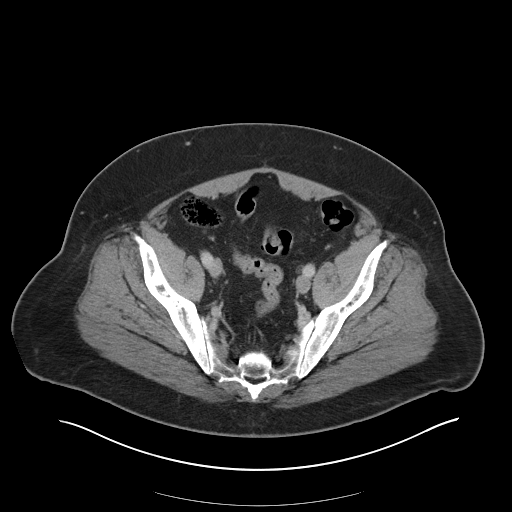
[im 44/118  soft-tissue]
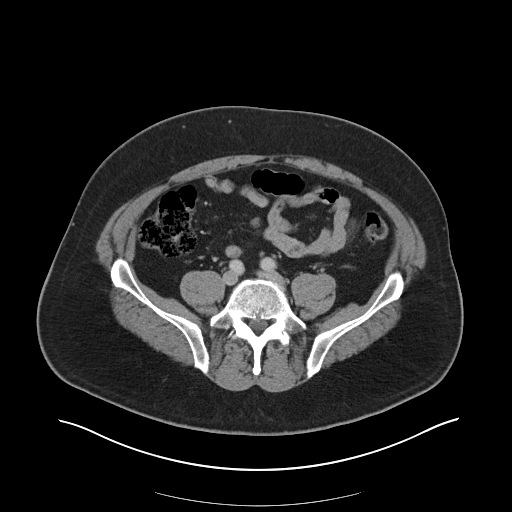
[im 50/118  soft-tissue]
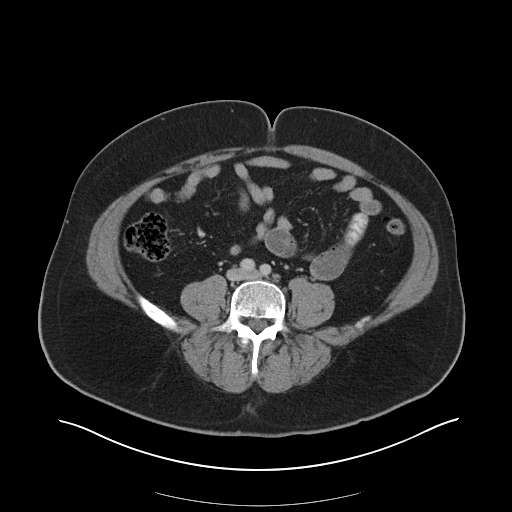
[im 62/118  soft-tissue]
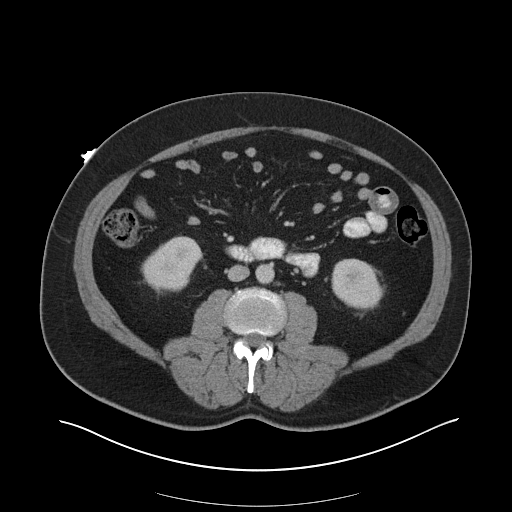
[im 68/118  soft-tissue]
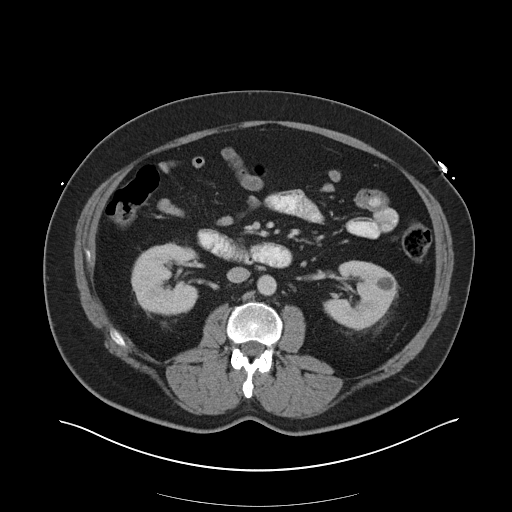
[im 74/118  soft-tissue]
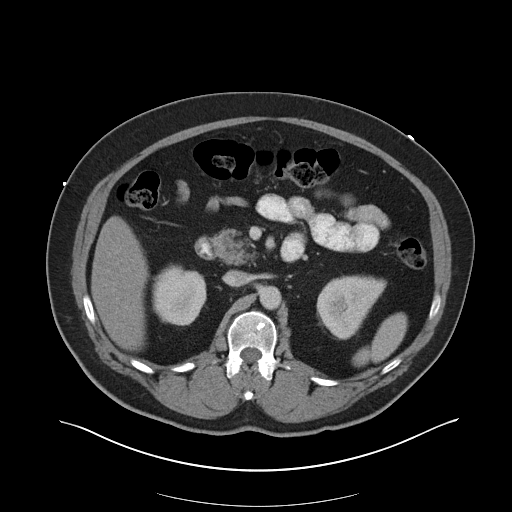
[im 74/118  bone]
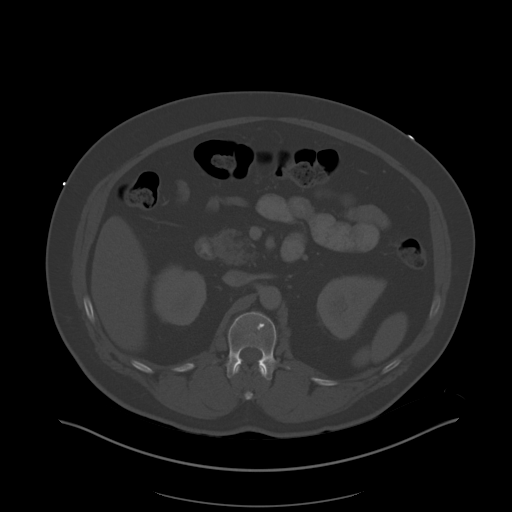
[im 87/118  soft-tissue]
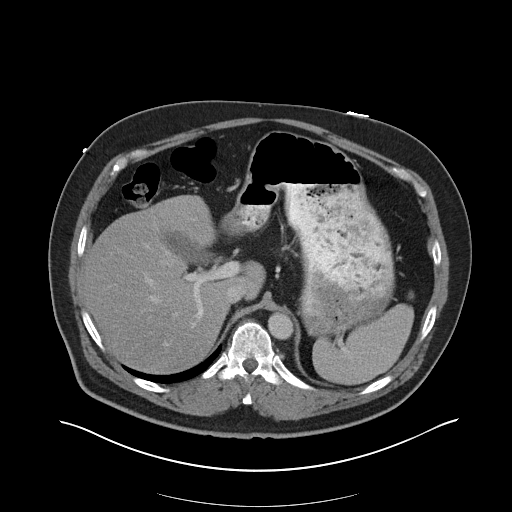
[im 93/118  soft-tissue]
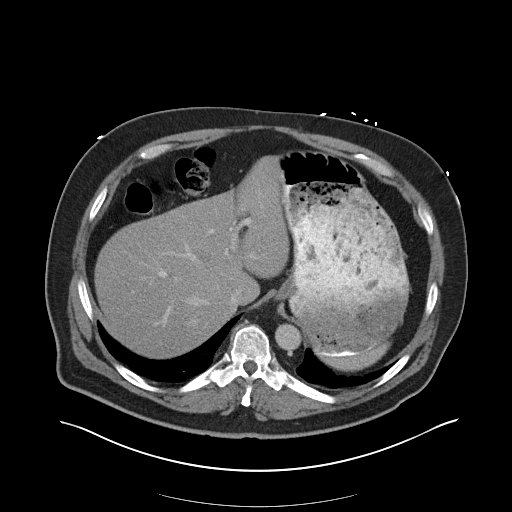
[im 99/118  soft-tissue]
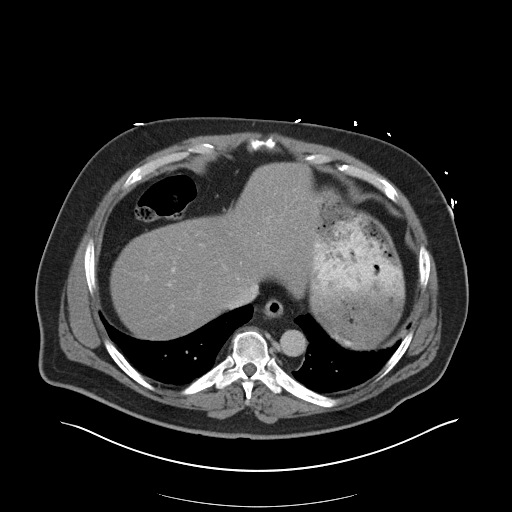
[im 111/118  soft-tissue]
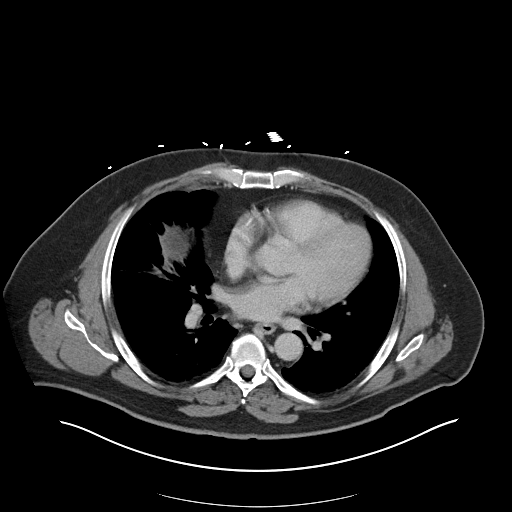

[Series 5: coronal soft tissue · coronal · 0.88mm/px · 3 of 120 slices shown]
[im 40/120  soft-tissue]
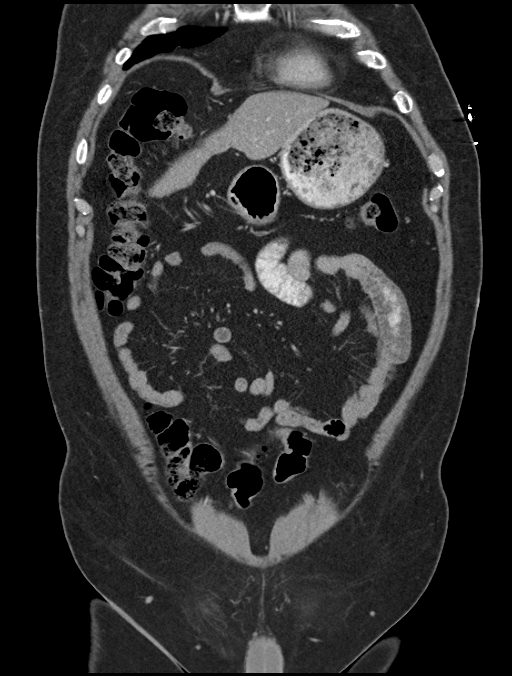
[im 53/120  soft-tissue]
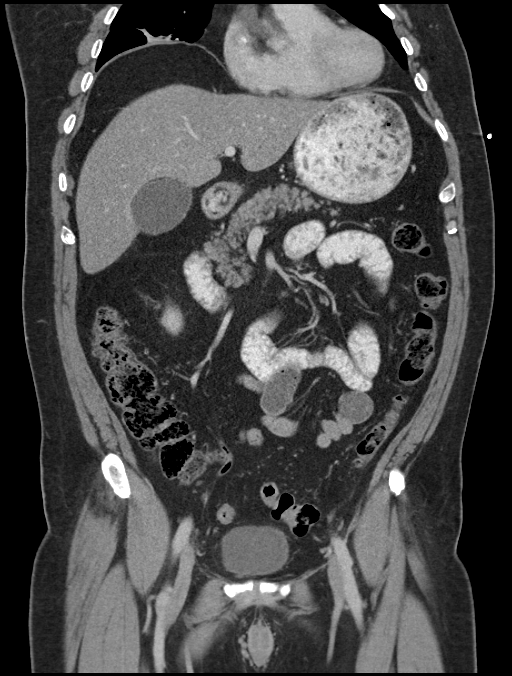
[im 67/120  soft-tissue]
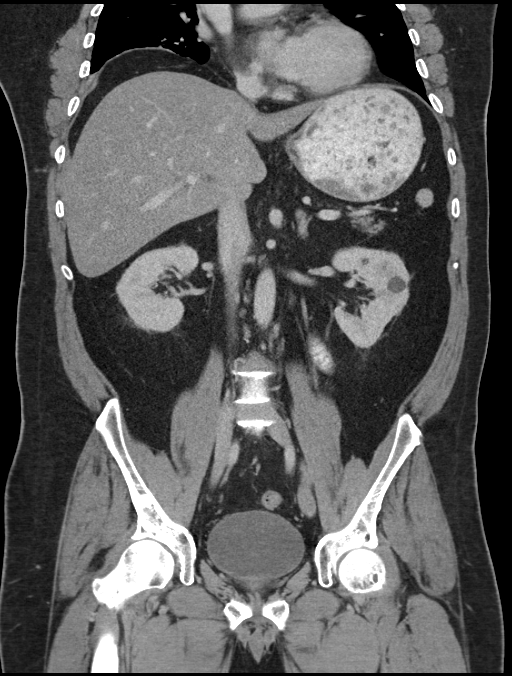

[16 of 46 positions shown; findings below may reference images not displayed]

FINDINGS: Multiple small calculi are present in the gallbladder lumen. There
is no mural thickening or pericholecystic inflammatory change. No
bile duct dilatation. There are normal appearances of the liver,
spleen, pancreas, adrenals and kidneys with the exception of a
cm left renal cyst laterally. The abdominal aorta is normal in
caliber. There is no atherosclerotic calcification. There is no
adenopathy in the abdomen or pelvis. There are normal appearances of
the stomach, small bowel and colon. The appendix is normal.

No acute inflammatory changes are evident in the abdomen or pelvis.
There is no ascites. There are small fat containing inguinal hernias
and a tiny fat containing umbilical hernia. There is linear right
base lung opacity which may be atelectatic or scarring.
IMPRESSION: 1. Cholelithiasis
2. Small fat containing inguinal hernias. Tiny fat containing
umbilical hernia.
3. No acute inflammatory changes are evident in the abdomen or
pelvis.

## 2018-03-28 ENCOUNTER — Other Ambulatory Visit (HOSPITAL_COMMUNITY): Payer: Self-pay | Admitting: Orthopedic Surgery

## 2018-04-14 ENCOUNTER — Encounter (HOSPITAL_BASED_OUTPATIENT_CLINIC_OR_DEPARTMENT_OTHER): Payer: Self-pay | Admitting: *Deleted

## 2018-04-14 ENCOUNTER — Other Ambulatory Visit: Payer: Self-pay

## 2018-04-18 ENCOUNTER — Encounter (HOSPITAL_BASED_OUTPATIENT_CLINIC_OR_DEPARTMENT_OTHER)
Admission: RE | Admit: 2018-04-18 | Discharge: 2018-04-18 | Disposition: A | Discharge status: Home / Self Care | Payer: Medicare Other | Source: Ambulatory Visit | Attending: Orthopedic Surgery | Admitting: Orthopedic Surgery

## 2018-04-18 ENCOUNTER — Other Ambulatory Visit: Payer: Self-pay

## 2018-04-18 DIAGNOSIS — M6701 Short Achilles tendon (acquired), right ankle: Secondary | ICD-10-CM | POA: Diagnosis not present

## 2018-04-18 DIAGNOSIS — Z01818 Encounter for other preprocedural examination: Secondary | ICD-10-CM

## 2018-04-18 DIAGNOSIS — Z87891 Personal history of nicotine dependence: Secondary | ICD-10-CM | POA: Diagnosis not present

## 2018-04-18 DIAGNOSIS — M19071 Primary osteoarthritis, right ankle and foot: Secondary | ICD-10-CM | POA: Diagnosis not present

## 2018-04-18 DIAGNOSIS — G4733 Obstructive sleep apnea (adult) (pediatric): Secondary | ICD-10-CM | POA: Diagnosis not present

## 2018-04-18 DIAGNOSIS — M76821 Posterior tibial tendinitis, right leg: Secondary | ICD-10-CM | POA: Diagnosis present

## 2018-04-18 DIAGNOSIS — N189 Chronic kidney disease, unspecified: Secondary | ICD-10-CM | POA: Diagnosis not present

## 2018-04-18 DIAGNOSIS — Z79899 Other long term (current) drug therapy: Secondary | ICD-10-CM | POA: Diagnosis not present

## 2018-04-18 DIAGNOSIS — E1122 Type 2 diabetes mellitus with diabetic chronic kidney disease: Secondary | ICD-10-CM | POA: Diagnosis not present

## 2018-04-18 DIAGNOSIS — Z7984 Long term (current) use of oral hypoglycemic drugs: Secondary | ICD-10-CM | POA: Diagnosis not present

## 2018-04-18 DIAGNOSIS — I129 Hypertensive chronic kidney disease with stage 1 through stage 4 chronic kidney disease, or unspecified chronic kidney disease: Secondary | ICD-10-CM | POA: Diagnosis not present

## 2018-04-18 DIAGNOSIS — E785 Hyperlipidemia, unspecified: Secondary | ICD-10-CM | POA: Diagnosis not present

## 2018-04-18 DIAGNOSIS — K219 Gastro-esophageal reflux disease without esophagitis: Secondary | ICD-10-CM | POA: Diagnosis not present

## 2018-04-18 LAB — BASIC METABOLIC PANEL
Anion gap: 11 (ref 5–15)
BUN: 17 mg/dL (ref 8–23)
CO2: 24 mmol/L (ref 22–32)
Calcium: 9.8 mg/dL (ref 8.9–10.3)
Chloride: 105 mmol/L (ref 98–111)
Creatinine, Ser: 1.23 mg/dL (ref 0.61–1.24)
GFR calc Af Amer: >60 mL/min (ref >60)
GFR calc non Af Amer: >60 mL/min (ref >60)
GLUCOSE: 95 mg/dL (ref 70–99)
Potassium: 4.3 mmol/L (ref 3.5–5.1)
Sodium: 140 mmol/L (ref 135–145)

## 2018-04-19 NOTE — Progress Notes (Signed)
EKG reviewed by Dr. Jenita Seashore, will proceed with surgery as scheduled.

## 2018-04-21 ENCOUNTER — Ambulatory Visit (HOSPITAL_BASED_OUTPATIENT_CLINIC_OR_DEPARTMENT_OTHER): Payer: Medicare Other | Admitting: Anesthesiology

## 2018-04-21 ENCOUNTER — Ambulatory Visit (HOSPITAL_BASED_OUTPATIENT_CLINIC_OR_DEPARTMENT_OTHER)
Admission: RE | Admit: 2018-04-21 | Discharge: 2018-04-21 | Disposition: A | Discharge status: Home / Self Care | Payer: Medicare Other | Attending: Orthopedic Surgery | Admitting: Orthopedic Surgery

## 2018-04-21 ENCOUNTER — Encounter (HOSPITAL_BASED_OUTPATIENT_CLINIC_OR_DEPARTMENT_OTHER)
Admission: RE | Disposition: A | Discharge status: Home / Self Care | Payer: Self-pay | Source: Home / Self Care | Attending: Orthopedic Surgery

## 2018-04-21 ENCOUNTER — Other Ambulatory Visit: Payer: Self-pay

## 2018-04-21 ENCOUNTER — Encounter (HOSPITAL_BASED_OUTPATIENT_CLINIC_OR_DEPARTMENT_OTHER): Payer: Self-pay | Admitting: *Deleted

## 2018-04-21 DIAGNOSIS — K219 Gastro-esophageal reflux disease without esophagitis: Secondary | ICD-10-CM | POA: Insufficient documentation

## 2018-04-21 DIAGNOSIS — M76821 Posterior tibial tendinitis, right leg: Secondary | ICD-10-CM | POA: Insufficient documentation

## 2018-04-21 DIAGNOSIS — E1122 Type 2 diabetes mellitus with diabetic chronic kidney disease: Secondary | ICD-10-CM | POA: Diagnosis not present

## 2018-04-21 DIAGNOSIS — M6701 Short Achilles tendon (acquired), right ankle: Secondary | ICD-10-CM | POA: Insufficient documentation

## 2018-04-21 DIAGNOSIS — Z86010 Personal history of colonic polyps: Secondary | ICD-10-CM

## 2018-04-21 DIAGNOSIS — I129 Hypertensive chronic kidney disease with stage 1 through stage 4 chronic kidney disease, or unspecified chronic kidney disease: Secondary | ICD-10-CM | POA: Insufficient documentation

## 2018-04-21 DIAGNOSIS — G4733 Obstructive sleep apnea (adult) (pediatric): Secondary | ICD-10-CM | POA: Insufficient documentation

## 2018-04-21 DIAGNOSIS — Z79899 Other long term (current) drug therapy: Secondary | ICD-10-CM | POA: Insufficient documentation

## 2018-04-21 DIAGNOSIS — Z7984 Long term (current) use of oral hypoglycemic drugs: Secondary | ICD-10-CM | POA: Insufficient documentation

## 2018-04-21 DIAGNOSIS — M19071 Primary osteoarthritis, right ankle and foot: Secondary | ICD-10-CM | POA: Insufficient documentation

## 2018-04-21 DIAGNOSIS — E785 Hyperlipidemia, unspecified: Secondary | ICD-10-CM | POA: Insufficient documentation

## 2018-04-21 DIAGNOSIS — Z87891 Personal history of nicotine dependence: Secondary | ICD-10-CM | POA: Insufficient documentation

## 2018-04-21 DIAGNOSIS — N189 Chronic kidney disease, unspecified: Secondary | ICD-10-CM | POA: Insufficient documentation

## 2018-04-21 HISTORY — PX: FOOT ARTHRODESIS: SHX1655

## 2018-04-21 HISTORY — PX: GASTROC RECESSION EXTREMITY: SHX6262

## 2018-04-21 LAB — GLUCOSE, CAPILLARY
GLUCOSE-CAPILLARY: 113 mg/dL — AB (ref 70–99)
Glucose-Capillary: 108 mg/dL — ABNORMAL HIGH (ref 70–99)

## 2018-04-21 SURGERY — RECESSION, TENDON, GASTROCNEMIUS
Anesthesia: General | Site: Leg Lower | Laterality: Right

## 2018-04-21 MED ORDER — CHLORHEXIDINE GLUCONATE 4 % EX LIQD: 60.0000 mL | Freq: Once | CUTANEOUS | Status: DC

## 2018-04-21 MED ORDER — FENTANYL CITRATE (PF) 100 MCG/2ML IJ SOLN
INTRAMUSCULAR | Status: AC
  Filled 2018-04-21: qty 2

## 2018-04-21 MED ORDER — MIDAZOLAM HCL 2 MG/2ML IJ SOLN: 1.0000 mg | INTRAMUSCULAR | Status: DC | PRN

## 2018-04-21 MED ORDER — MIDAZOLAM HCL 2 MG/2ML IJ SOLN
INTRAMUSCULAR | Status: AC
  Filled 2018-04-21: qty 2

## 2018-04-21 MED ORDER — CEFAZOLIN SODIUM-DEXTROSE 2-4 GM/100ML-% IV SOLN
2.0000 g | INTRAVENOUS | Status: AC
  Administered 2018-04-21: 2 g via INTRAVENOUS

## 2018-04-21 MED ORDER — PROPOFOL 10 MG/ML IV BOLUS
INTRAVENOUS | Status: DC | PRN
  Administered 2018-04-21: 150 mg via INTRAVENOUS

## 2018-04-21 MED ORDER — FENTANYL CITRATE (PF) 100 MCG/2ML IJ SOLN
50.0000 ug | INTRAMUSCULAR | Status: DC | PRN
  Administered 2018-04-21 (×2): 50 ug via INTRAVENOUS

## 2018-04-21 MED ORDER — SENNA 8.6 MG PO TABS: 2.0000 | ORAL_TABLET | Freq: Two times a day (BID) | ORAL | 0 refills | Status: AC

## 2018-04-21 MED ORDER — CEFAZOLIN SODIUM-DEXTROSE 2-4 GM/100ML-% IV SOLN
INTRAVENOUS | Status: AC
  Filled 2018-04-21: qty 100

## 2018-04-21 MED ORDER — DEXAMETHASONE SODIUM PHOSPHATE 10 MG/ML IJ SOLN
INTRAMUSCULAR | Status: DC | PRN
  Administered 2018-04-21: 10 mg via INTRAVENOUS

## 2018-04-21 MED ORDER — ROPIVACAINE HCL 5 MG/ML IJ SOLN
INTRAMUSCULAR | Status: DC | PRN
  Administered 2018-04-21: 15 mL via PERINEURAL
  Administered 2018-04-21: 30 mL via PERINEURAL

## 2018-04-21 MED ORDER — CLONIDINE HCL (ANALGESIA) 100 MCG/ML EP SOLN
EPIDURAL | Status: DC | PRN
  Administered 2018-04-21 (×2): 50 ug

## 2018-04-21 MED ORDER — 0.9 % SODIUM CHLORIDE (POUR BTL) OPTIME
TOPICAL | Status: DC | PRN
  Administered 2018-04-21: 400 mL

## 2018-04-21 MED ORDER — DOCUSATE SODIUM 100 MG PO CAPS: 100.0000 mg | ORAL_CAPSULE | Freq: Two times a day (BID) | ORAL | 0 refills | Status: AC

## 2018-04-21 MED ORDER — SCOPOLAMINE 1 MG/3DAYS TD PT72: 1.0000 | MEDICATED_PATCH | Freq: Once | TRANSDERMAL | Status: DC | PRN

## 2018-04-21 MED ORDER — LACTATED RINGERS IV SOLN
INTRAVENOUS | Status: DC
  Administered 2018-04-21 (×2): via INTRAVENOUS

## 2018-04-21 MED ORDER — ASPIRIN EC 81 MG PO TBEC: 81.0000 mg | DELAYED_RELEASE_TABLET | Freq: Two times a day (BID) | ORAL | 0 refills | Status: AC

## 2018-04-21 MED ORDER — VANCOMYCIN HCL 500 MG IV SOLR
INTRAVENOUS | Status: DC | PRN
  Administered 2018-04-21: 500 mg via TOPICAL

## 2018-04-21 MED ORDER — OXYCODONE HCL 5 MG PO TABS: 5.0000 mg | ORAL_TABLET | ORAL | 0 refills | Status: AC | PRN

## 2018-04-21 MED ORDER — LIDOCAINE HCL (CARDIAC) PF 100 MG/5ML IV SOSY
PREFILLED_SYRINGE | INTRAVENOUS | Status: DC | PRN
  Administered 2018-04-21: 30 mg via INTRAVENOUS

## 2018-04-21 MED ORDER — SODIUM CHLORIDE 0.9 % IV SOLN: INTRAVENOUS | Status: DC

## 2018-04-21 MED ORDER — ONDANSETRON HCL 4 MG/2ML IJ SOLN
INTRAMUSCULAR | Status: DC | PRN
  Administered 2018-04-21 (×2): 4 mg via INTRAVENOUS

## 2018-04-21 SURGICAL SUPPLY — 81 items
BANDAGE ESMARK 6X9 LF (GAUZE/BANDAGES/DRESSINGS) IMPLANT
BIT DRILL 4.8X200 CANN (BIT) ×5 IMPLANT
BIT DRILL CANN 3.5MM (DRILL) ×3 IMPLANT
BLADE AVERAGE 25MMX9MM (BLADE)
BLADE AVERAGE 25X9 (BLADE) IMPLANT
BLADE MICRO SAGITTAL (BLADE) IMPLANT
BLADE SURG 15 STRL LF DISP TIS (BLADE) ×9 IMPLANT
BLADE SURG 15 STRL SS (BLADE) ×6
BNDG COHESIVE 4X5 TAN STRL (GAUZE/BANDAGES/DRESSINGS) ×5 IMPLANT
BNDG COHESIVE 6X5 TAN STRL LF (GAUZE/BANDAGES/DRESSINGS) ×5 IMPLANT
BNDG ESMARK 6X9 LF (GAUZE/BANDAGES/DRESSINGS)
BOOT STEPPER DURA LG (SOFTGOODS) IMPLANT
BOOT STEPPER DURA MED (SOFTGOODS) IMPLANT
BOOT STEPPER DURA XLG (SOFTGOODS) IMPLANT
CHLORAPREP W/TINT 26ML (MISCELLANEOUS) ×5 IMPLANT
CLOSURE WOUND 1/2 X4 (GAUZE/BANDAGES/DRESSINGS)
COVER BACK TABLE 60X90IN (DRAPES) ×10 IMPLANT
COVER MAYO STAND STRL (DRAPES) ×5 IMPLANT
COVER WAND RF STERILE (DRAPES) IMPLANT
CUFF TOURNIQUET SINGLE 34IN LL (TOURNIQUET CUFF) ×5 IMPLANT
DECANTER SPIKE VIAL GLASS SM (MISCELLANEOUS) IMPLANT
DRAPE EXTREMITY T 121X128X90 (DISPOSABLE) ×5 IMPLANT
DRAPE OEC MINIVIEW 54X84 (DRAPES) ×5 IMPLANT
DRAPE U-SHAPE 47X51 STRL (DRAPES) ×5 IMPLANT
DRAPE UTILITY XL STRL (DRAPES) IMPLANT
DRILL CANN 3.5MM (DRILL) ×5
DRSG MEPITEL 4X7.2 (GAUZE/BANDAGES/DRESSINGS) ×5 IMPLANT
DRSG PAD ABDOMINAL 8X10 ST (GAUZE/BANDAGES/DRESSINGS) ×10 IMPLANT
ELECT REM PT RETURN 9FT ADLT (ELECTROSURGICAL) ×5
ELECTRODE REM PT RTRN 9FT ADLT (ELECTROSURGICAL) ×3 IMPLANT
GAUZE SPONGE 4X4 12PLY STRL (GAUZE/BANDAGES/DRESSINGS) ×5 IMPLANT
GLOVE BIO SURGEON STRL SZ 6.5 (GLOVE) ×4 IMPLANT
GLOVE BIO SURGEON STRL SZ8 (GLOVE) ×5 IMPLANT
GLOVE BIO SURGEONS STRL SZ 6.5 (GLOVE) ×1
GLOVE BIOGEL PI IND STRL 7.0 (GLOVE) ×6 IMPLANT
GLOVE BIOGEL PI IND STRL 8 (GLOVE) ×6 IMPLANT
GLOVE BIOGEL PI INDICATOR 7.0 (GLOVE) ×4
GLOVE BIOGEL PI INDICATOR 8 (GLOVE) ×4
GLOVE ECLIPSE 8.0 STRL XLNG CF (GLOVE) ×5 IMPLANT
GOWN STRL REUS W/ TWL LRG LVL3 (GOWN DISPOSABLE) ×3 IMPLANT
GOWN STRL REUS W/ TWL XL LVL3 (GOWN DISPOSABLE) ×6 IMPLANT
GOWN STRL REUS W/TWL LRG LVL3 (GOWN DISPOSABLE) ×2
GOWN STRL REUS W/TWL XL LVL3 (GOWN DISPOSABLE) ×4
K-WIRE 9  SMOOTH .062 (WIRE) IMPLANT
KIT ASP BONE MRW 60CC (KITS) IMPLANT
KIT STAPLE ARCUS 16X13 STRL (Staple) ×5 IMPLANT
MILL MEDIUM DISP (BLADE) IMPLANT
NEEDLE HYPO 22GX1.5 SAFETY (NEEDLE) IMPLANT
NS IRRIG 1000ML POUR BTL (IV SOLUTION) ×5 IMPLANT
PACK BASIN DAY SURGERY FS (CUSTOM PROCEDURE TRAY) ×5 IMPLANT
PAD CAST 4YDX4 CTTN HI CHSV (CAST SUPPLIES) ×3 IMPLANT
PADDING CAST COTTON 4X4 STRL (CAST SUPPLIES) ×2
PADDING CAST COTTON 6X4 STRL (CAST SUPPLIES) ×5 IMPLANT
PENCIL BUTTON HOLSTER BLD 10FT (ELECTRODE) ×5 IMPLANT
PIN GUIDE DRILL TIP 2.8X300 (DRILL) ×5 IMPLANT
SANITIZER HAND PURELL 535ML FO (MISCELLANEOUS) ×5 IMPLANT
SCREW 8.0X80MMX16 (Screw) ×5 IMPLANT
SCREW CANNULATED 6.5X65 KNEE (Screw) ×5 IMPLANT
SCREW P/T IMPL CANN 5.0X36 (Screw) ×5 IMPLANT
SHEET MEDIUM DRAPE 40X70 STRL (DRAPES) ×5 IMPLANT
SLEEVE SCD COMPRESS KNEE MED (MISCELLANEOUS) ×5 IMPLANT
SPLINT FAST PLASTER 5X30 (CAST SUPPLIES) ×40
SPLINT PLASTER CAST FAST 5X30 (CAST SUPPLIES) ×60 IMPLANT
SPONGE LAP 18X18 RF (DISPOSABLE) ×5 IMPLANT
STOCKINETTE 6  STRL (DRAPES) ×2
STOCKINETTE 6 STRL (DRAPES) ×3 IMPLANT
STRIP CLOSURE SKIN 1/2X4 (GAUZE/BANDAGES/DRESSINGS) IMPLANT
SUCTION FRAZIER HANDLE 10FR (MISCELLANEOUS) ×2
SUCTION TUBE FRAZIER 10FR DISP (MISCELLANEOUS) ×3 IMPLANT
SUT ETHILON 3 0 PS 1 (SUTURE) ×10 IMPLANT
SUT MNCRL AB 3-0 PS2 18 (SUTURE) ×5 IMPLANT
SUT VIC AB 0 SH 27 (SUTURE) ×5 IMPLANT
SUT VIC AB 2-0 SH 27 (SUTURE) ×2
SUT VIC AB 2-0 SH 27XBRD (SUTURE) ×3 IMPLANT
SYR BULB 3OZ (MISCELLANEOUS) ×5 IMPLANT
SYR CONTROL 10ML LL (SYRINGE) IMPLANT
TOWEL GREEN STERILE FF (TOWEL DISPOSABLE) ×15 IMPLANT
TUBE CONNECTING 20'X1/4 (TUBING) ×1
TUBE CONNECTING 20X1/4 (TUBING) ×4 IMPLANT
UNDERPAD 30X30 (UNDERPADS AND DIAPERS) ×5 IMPLANT
YANKAUER SUCT BULB TIP NO VENT (SUCTIONS) IMPLANT

## 2018-04-21 NOTE — Op Note (Signed)
04/21/2018  1:48 PM  PATIENT:  Tyler Wiggins  68 y.o. male  PRE-OPERATIVE DIAGNOSIS: 1.  Right stage 3 posterior tibial tendon dysfunction      2.  Short right achilles tendon  POST-OPERATIVE DIAGNOSIS: same  Procedure(s): 1.  Right gastrocnemius recession 2.  Right talonavicular joint arthrodesis 3.  Right subtalar joint arthrodesis 4.  Right foot AP, lateral and Harris heel xrays 5.  Right ankle AP and lateral xrays  SURGEON:  Wylene Simmer, MD  ASSISTANT: Mechele Claude, PA-C  ANESTHESIA:   General, regional  EBL:  minimal   TOURNIQUET:   Total Tourniquet Time Documented: Thigh (Right) - 97 minutes Total: Thigh (Right) - 97 minutes  COMPLICATIONS:  None apparent  DISPOSITION:  Extubated, awake and stable to recovery.  INDICATION FOR PROCEDURE: The patient is a 68 year old male with a past medical history significant for diabetes.  He has a long history of right hindfoot pain due to posterior tibial tendon dysfunction.  He has developed degenerative changes of the subtalar joint as well as spring ligament making this stage III.  He also has a tight heel cord.  He has failed nonoperative treatment to date including activity modification, bracing, physical therapy, oral anti-inflammatories and shoewear modification.  He presents now for operative treatment of this painful right ankle and hindfoot condition.  The risks and benefits of the alternative treatment options have been discussed in detail.  The patient wishes to proceed with surgery and specifically understands risks of bleeding, infection, nerve damage, blood clots, need for additional surgery, amputation and death.  PROCEDURE IN DETAIL:  After pre operative consent was obtained, and the correct operative site was identified, the patient was brought to the operating room and placed supine on the OR table.  Anesthesia was administered.  Pre-operative antibiotics were administered.  A surgical timeout was taken.  The right  lower extremity was prepped and draped in standard sterile fashion with a tourniquet around the thigh.  The extremity was elevated and the tourniquet was inflated to 250 mmHg.  A longitudinal incision was then made at the medial calf.  Dissection was carried down through the subcutaneous tissues.  Care was taken to protect the saphenous vein and sural nerve.  Superficial fascia was incised.  The gastrocnemius tendon was identified.  It was divided from medial to lateral under direct vision.  Wound was then irrigated and closed with Monocryl and nylon.  Attention was turned to the lateral aspect of the hindfoot.  An incision was made adjacent to the sinus Tarsi, and dissection was carried down through the subcutaneous tissues.  The interval between the extensor digitorum brevis and peroneal tendons was developed.  The posterior facet of the subtalar joint was exposed.  All remaining articular cartilage was removed from both sides of the joint with a curette and rondure.  The wound was irrigated copiously.  The subchondral bone was perforated with a drill bit and then further broken up with 1/4 inch curved osteotome.  Attention was then turned to the dorsum of the hindfoot where a longitudinal incision was made.  Dissection was carried down through subcutaneous tissues.  The extensor retinaculum was incised over the EHL tendon.  The interval between the tibialis anterior and EHL was developed taking care to protect the neurovascular bundle.  The talonavicular joint was identified.  The joint capsule was incised exposing the head of the talus.  All remaining articular cartilage was removed with a curette and rondure.  The navicular was debrided  of all remaining cartilage as well.  The subchondral bone on both sides of the joint was then perforated with a drill bit leaving the resultant bone graft in place.  The joint was reduced and provisionally pinned.  AP and lateral radiographs confirmed appropriate position of  the guidepin and reduction of the joint.  An incision was made at the guidewire.  Guidewire was overdrilled and a 5 mm partially-threaded cannulated screw from the [employer] set was then inserted.  It was noted to compress the talonavicular joint appropriately.  A small incision was then made at the heel.  A guidepin was placed from the tuberosity of the calcaneus across the posterior facet of the subtalar joint into the dome of the talus.  AP ankle, Harris heel foot and lateral foot x-rays showed appropriate alignment of the guidepin.  It was overdrilled and an 8 mm partially-threaded cannulated screw was inserted.  It was noted to compress the subtalar joint appropriately.  Attention was returned to the dorsal incision.  A 16 mm arcus staple was inserted across the lateral aspect of the talonavicular joint further compressing it.  A guidepin was then inserted from the neck of the talus across to the anterior process of the calcaneus.  Lateral and Harris heel radiographs confirmed appropriate position of the guidepin.  It was overdrilled and a fully threaded 6.5 mm cannulated screw was inserted further fixing the subtalar joint.  AP and lateral radiographs of the ankle show appropriate position and length of the subtalar screw.  AP, lateral and Harris heel radiographs of the foot show appropriate position and length of all hardware and appropriate reduction of the subtalar and talonavicular joints.  All of the wounds were then irrigated copiously.  The deep subcutaneous tissues were approximated with 0 Vicryl.  The retinaculum was repaired at the dorsal incision with 0 Vicryl.  Subcutaneous tissues were then closed with Monocryl.  Skin incisions were closed with nylon.  Sterile dressings were applied followed by a well-padded short leg splint.  The tourniquet was released after application of the dressings.  The patient was awakened from anesthesia and transported to the recovery room in stable  condition.  FOLLOW UP PLAN: If the patient is doing well in recovery then he will be discharged home.  Otherwise he will remain overnight for observation for pain control and for use of the CPAP.  He will follow-up with me in the office in 2 weeks for suture removal and conversion to a short leg cast.  Aspirin 81 mg p.o. twice daily for DVT prophylaxis.   RADIOGRAPHS: AP and lateral radiographs of the right ankle are obtained intraoperatively.  These show appropriate position of the screw within the dome of the talus.  No significant degenerative changes are noted at the ankle joint.  AP, lateral and Harris heel radiographs of the right foot are obtained intraoperatively.  These show interval arthrodesis of the subtalar and talonavicular joints.  Hardware is appropriately positioned and of the appropriate lengths.    Mechele Claude PA-C was present and scrubbed for the duration of the operative case. His assistance was essential in positioning the patient, prepping and draping, gaining and maintaining exposure, performing the operation, closing and dressing the wounds and applying the splint.

## 2018-04-21 NOTE — Anesthesia Procedure Notes (Signed)
Procedure Name: LMA Insertion Date/Time: 04/21/2018 11:41 AM Performed by: Signe Colt, CRNA Pre-anesthesia Checklist: Patient identified, Emergency Drugs available, Suction available and Patient being monitored Patient Re-evaluated:Patient Re-evaluated prior to induction Oxygen Delivery Method: Circle system utilized Preoxygenation: Pre-oxygenation with 100% oxygen Induction Type: IV induction Ventilation: Mask ventilation without difficulty LMA: LMA inserted LMA Size: 4.0 Number of attempts: 1 Airway Equipment and Method: Bite block Placement Confirmation: positive ETCO2 Tube secured with: Tape Dental Injury: Teeth and Oropharynx as per pre-operative assessment

## 2018-04-21 NOTE — Progress Notes (Signed)
Dr. Valma Cava by to see pt and discuss discharge planning.  Pt requesting to go home and Dr. Valma Cava states he is doing well and can be discharged after recovery.

## 2018-04-21 NOTE — Anesthesia Procedure Notes (Addendum)
Anesthesia Regional Block: Adductor canal block   Pre-Anesthetic Checklist: ,, timeout performed, Correct Patient, Correct Site, Correct Laterality, Correct Procedure, Correct Position, site marked, Risks and benefits discussed,  Surgical consent,  Pre-op evaluation,  At surgeon's request and post-op pain management  Laterality: Lower and Left  Prep: chloraprep       Needles:  Injection technique: Single-shot  Needle Type: Echogenic Needle     Needle Length: 9cm  Needle Gauge: 22     Additional Needles:   Procedures:,,,, ultrasound used (permanent image in chart),,,,  Narrative:  Start time: 04/21/2018 11:18 AM End time: 04/21/2018 11:24 AM Injection made incrementally with aspirations every 5 mL.  Performed by: Personally  Anesthesiologist: Barnet Glasgow, MD  Additional Notes: Block assessed prior to surgery. Pt tolerated procedure well.

## 2018-04-21 NOTE — Progress Notes (Signed)
Assisted Dr. Valma Cava with right, ultrasound guided, popliteal, adductor canal block. Side rails up, monitors on throughout procedure. See vital signs in flow sheet. Tolerated Procedure well. Time out done at 11:00. Procedure started at 11:02. Procedure ended at 11:13. 11:20 timeout charted in error.

## 2018-04-21 NOTE — Anesthesia Postprocedure Evaluation (Signed)
Anesthesia Post Note  Patient: Tyler Wiggins  Procedure(s) Performed: Milus Height Recession (Right Leg Lower) Right Subtalar and Talonavicular Joint Arthrodesis (Right Foot)     Patient location during evaluation: PACU Anesthesia Type: General and Regional Level of consciousness: awake and alert Pain management: pain level controlled Vital Signs Assessment: post-procedure vital signs reviewed and stable Respiratory status: spontaneous breathing, nonlabored ventilation, respiratory function stable and patient connected to nasal cannula oxygen Cardiovascular status: blood pressure returned to baseline and stable Postop Assessment: no apparent nausea or vomiting Anesthetic complications: no    Last Vitals:  Vitals:   04/21/18 1339 04/21/18 1345  BP: 117/65 125/81  Pulse: 70 74  Resp:  19  Temp: 36.6 C   SpO2: 99% 98%    Last Pain:  Vitals:   04/21/18 1339  TempSrc:   PainSc: 0-No pain                 Barnet Glasgow

## 2018-04-21 NOTE — Discharge Instructions (Addendum)
Post Anesthesia Home Care Instructions  Activity: Get plenty of rest for the remainder of the day. A responsible individual must stay with you for 24 hours following the procedure.  For the next 24 hours, DO NOT: -Drive a car -Paediatric nurse -Drink alcoholic beverages -Take any medication unless instructed by your physician -Make any legal decisions or sign important papers.  Meals: Start with liquid foods such as gelatin or soup. Progress to regular foods as tolerated. Avoid greasy, spicy, heavy foods. If nausea and/or vomiting occur, drink only clear liquids until the nausea and/or vomiting subsides. Call your physician if vomiting continues.  Special Instructions/Symptoms: Your throat may feel dry or sore from the anesthesia or the breathing tube placed in your throat during surgery. If this causes discomfort, gargle with warm salt water. The discomfort should disappear within 24 hours.  If you had a scopolamine patch placed behind your ear for the management of post- operative nausea and/or vomiting:  1. The medication in the patch is effective for 72 hours, after which it should be removed.  Wrap patch in a tissue and discard in the trash. Wash hands thoroughly with soap and water. 2. You may remove the patch earlier than 72 hours if you experience unpleasant side effects which may include dry mouth, dizziness or visual disturbances. 3. Avoid touching the patch. Wash your hands with soap and water after contact with the patch.    Wylene Simmer, MD [place]  Please read the following information regarding your care after surgery.  Medications  You only need a prescription for the narcotic pain medicine (ex. oxycodone, Percocet, Norco).  All of the other medicines listed below are available over the counter. X Aleve 2 pills twice a day for the first 3 days after surgery. X acetominophen (Tylenol) 650 mg every 4-6 hours as you need for minor to moderate pain X  oxycodone as prescribed for severe pain  Narcotic pain medicine (ex. oxycodone, Percocet, Vicodin) will cause constipation.  To prevent this problem, take the following medicines while you are taking any pain medicine. X docusate sodium (Colace) 100 mg twice a day X senna (Senokot) 2 tablets twice a day  X To help prevent blood clots, take a baby aspirin (81 mg) twice a day after surgery.  You should also get up every hour while you are awake to move around.    Weight Bearing X Do not bear any weight on the operated leg or foot.  Cast / Splint / Dressing X Keep your splint, cast or dressing clean and dry.  Dont put anything (coat hanger, pencil, etc) down inside of it.  If it gets damp, use a hair dryer on the cool setting to dry it.  If it gets soaked, call the office to schedule an appointment for a cast change.   After your dressing, cast or splint is removed; you may shower, but do not soak or scrub the wound.  Allow the water to run over it, and then gently pat it dry.  Swelling It is normal for you to have swelling where you had surgery.  To reduce swelling and pain, keep your toes above your nose for at least 3 days after surgery.  It may be necessary to keep your foot or leg elevated for several weeks.  If it hurts, it should be elevated.  Follow Up Call my office at [phone] when you are discharged from the hospital or surgery center to schedule an appointment to be seen two  weeks after surgery.  Call my office at [phone] if you develop a fever >101.5 F, nausea, vomiting, bleeding from the surgical site or severe pain.       Regional Anesthesia Blocks  1. Numbness or the inability to move the "blocked" extremity may last from 3-48 hours after placement. The length of time depends on the medication injected and your individual response to the medication. If the numbness is not going away after 48 hours, call your surgeon.  2. The extremity that is blocked will need  to be protected until the numbness is gone and the  Strength has returned. Because you cannot feel it, you will need to take extra care to avoid injury. Because it may be weak, you may have difficulty moving it or using it. You may not know what position it is in without looking at it while the block is in effect.  3. For blocks in the legs and feet, returning to weight bearing and walking needs to be done carefully. You will need to wait until the numbness is entirely gone and the strength has returned. You should be able to move your leg and foot normally before you try and bear weight or walk. You will need someone to be with you when you first try to ensure you do not fall and possibly risk injury.  4. Bruising and tenderness at the needle site are common side effects and will resolve in a few days.  5. Persistent numbness or new problems with movement should be communicated to the surgeon or the [facility] [phone] [place] [phone]).        Post Anesthesia Home Care Instructions  Activity: Get plenty of rest for the remainder of the day. A responsible individual must stay with you for 24 hours following the procedure.  For the next 24 hours, DO NOT: -Drive a car -Paediatric nurse -Drink alcoholic beverages -Take any medication unless instructed by your physician -Make any legal decisions or sign important papers.  Meals: Start with liquid foods such as gelatin or soup. Progress to regular foods as tolerated. Avoid greasy, spicy, heavy foods. If nausea and/or vomiting occur, drink only clear liquids until the nausea and/or vomiting subsides. Call your physician if vomiting continues.  Special Instructions/Symptoms: Your throat may feel dry or sore from the anesthesia or the breathing tube placed in your throat during surgery. If this causes discomfort, gargle with warm salt water. The discomfort should disappear within 24 hours.  If you  had a scopolamine patch placed behind your ear for the management of post- operative nausea and/or vomiting:  1. The medication in the patch is effective for 72 hours, after which it should be removed.  Wrap patch in a tissue and discard in the trash. Wash hands thoroughly with soap and water. 2. You may remove the patch earlier than 72 hours if you experience unpleasant side effects which may include dry mouth, dizziness or visual disturbances. 3. Avoid touching the patch. Wash your hands with soap and water after contact with the patch.

## 2018-04-21 NOTE — Anesthesia Preprocedure Evaluation (Addendum)
Anesthesia Evaluation  Patient identified by MRN, date of birth, ID band Patient awake    Reviewed: Allergy & Precautions, NPO status , Patient's Chart, lab work & pertinent test results  Airway Mallampati: II  TM Distance: >3 FB Neck ROM: Full    Dental no notable dental hx.    Pulmonary sleep apnea , former smoker,    Pulmonary exam normal breath sounds clear to auscultation       Cardiovascular hypertension, Pt. on medications Normal cardiovascular exam Rhythm:Regular Rate:Normal  04/18/2018 EKG SR  R 67   Neuro/Psych negative neurological ROS  negative psych ROS   GI/Hepatic GERD  Medicated,  Endo/Other  diabetes, Type 2  Renal/GU      Musculoskeletal  (+) Arthritis ,   Abdominal (+) + obese,   Peds  Hematology   Anesthesia Other Findings   Reproductive/Obstetrics                             Anesthesia Physical Anesthesia Plan  ASA: III  Anesthesia Plan: General   Post-op Pain Management:  Regional for Post-op pain   Induction: Intravenous  PONV Risk Score and Plan: Treatment may vary due to age or medical condition, Ondansetron, Dexamethasone and Midazolam  Airway Management Planned: LMA  Additional Equipment:   Intra-op Plan:   Post-operative Plan:   Informed Consent: I have reviewed the patients History and Physical, chart, labs and discussed the procedure including the risks, benefits and alternatives for the proposed anesthesia with the patient or authorized representative who has indicated his/her understanding and acceptance.     Dental advisory given  Plan Discussed with:   Anesthesia Plan Comments: (R Subtalar Arthrodesis W GA Pop plus saph)        Anesthesia Quick Evaluation
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
## 2018-04-21 NOTE — Anesthesia Procedure Notes (Signed)
Anesthesia Regional Block: Popliteal block   Pre-Anesthetic Checklist: ,, timeout performed, Correct Patient, Correct Site, Correct Laterality, Correct Procedure, Correct Position, site marked, Risks and benefits discussed, pre-op evaluation,  At surgeon's request and post-op pain management  Laterality: Right  Prep: Maximum Sterile Barrier Precautions used, chloraprep       Needles:  Injection technique: Single-shot  Needle Type: Echogenic Needle     Needle Length: 9cm  Needle Gauge: 21     Additional Needles:   Procedures:,,,, ultrasound used (permanent image in chart),,,,  Narrative:  Start time: 04/21/2018 11:07 AM End time: 04/21/2018 11:17 AM Injection made incrementally with aspirations every 5 mL.  Performed by: Personally  Anesthesiologist: Barnet Glasgow, MD  Additional Notes: Block assessed. Patient tolerated procedure well.

## 2018-04-21 NOTE — Transfer of Care (Signed)
Immediate Anesthesia Transfer of Care Note  Patient: Tyler Wiggins  Procedure(s) Performed: Milus Height Recession (Right Leg Lower) Right Subtalar and Talonavicular Joint Arthrodesis (Right Foot)  Patient Location: PACU  Anesthesia Type:GA combined with regional for post-op pain  Level of Consciousness: awake, alert , oriented, drowsy and patient cooperative  Airway & Oxygen Therapy: Patient Spontanous Breathing and Patient connected to face mask oxygen  Post-op Assessment: Report given to RN and Post -op Vital signs reviewed and stable  Post vital signs: Reviewed and stable  Last Vitals:  Vitals Value Taken Time  BP 117/65 04/21/2018  1:39 PM  Temp    Pulse 71 04/21/2018  1:43 PM  Resp 16 04/21/2018  1:43 PM  SpO2 100 % 04/21/2018  1:43 PM  Vitals shown include unvalidated device data.  Last Pain:  Vitals:   04/21/18 1022  TempSrc: Oral  PainSc: 4       Patients Stated Pain Goal: 4 (01/13/12 1438)  Complications: No apparent anesthesia complications

## 2018-04-22 ENCOUNTER — Encounter (HOSPITAL_BASED_OUTPATIENT_CLINIC_OR_DEPARTMENT_OTHER): Payer: Self-pay | Admitting: Orthopedic Surgery

## 2018-04-25 NOTE — Addendum Note (Signed)
Addendum  created 04/25/18 1518 by Barnet Glasgow, MD   Clinical Note Signed, Intraprocedure Blocks edited

## 2018-10-11 ENCOUNTER — Other Ambulatory Visit: Payer: Self-pay | Admitting: Student

## 2018-10-11 DIAGNOSIS — M67961 Unspecified disorder of synovium and tendon, right lower leg: Secondary | ICD-10-CM

## 2018-10-12 ENCOUNTER — Ambulatory Visit
Admission: RE | Admit: 2018-10-12 | Discharge: 2018-10-12 | Disposition: A | Discharge status: Home / Self Care | Payer: Medicare Other | Source: Ambulatory Visit | Attending: Student | Admitting: Student

## 2018-10-12 DIAGNOSIS — M67961 Unspecified disorder of synovium and tendon, right lower leg: Secondary | ICD-10-CM

## 2019-03-20 DIAGNOSIS — Z0100 Encounter for examination of eyes and vision without abnormal findings: Secondary | ICD-10-CM | POA: Diagnosis not present

## 2019-03-27 ENCOUNTER — Other Ambulatory Visit: Payer: Self-pay

## 2019-03-27 ENCOUNTER — Emergency Department (HOSPITAL_COMMUNITY): Payer: Medicare HMO

## 2019-03-27 ENCOUNTER — Emergency Department (HOSPITAL_COMMUNITY)
Admission: EM | Admit: 2019-03-27 | Discharge: 2019-03-27 | Disposition: A | Discharge status: Home / Self Care | Payer: Medicare HMO | Attending: Emergency Medicine | Admitting: Emergency Medicine

## 2019-03-27 ENCOUNTER — Encounter (HOSPITAL_COMMUNITY): Payer: Self-pay | Admitting: Emergency Medicine

## 2019-03-27 DIAGNOSIS — R072 Precordial pain: Secondary | ICD-10-CM | POA: Diagnosis not present

## 2019-03-27 DIAGNOSIS — I10 Essential (primary) hypertension: Secondary | ICD-10-CM | POA: Insufficient documentation

## 2019-03-27 DIAGNOSIS — Z7982 Long term (current) use of aspirin: Secondary | ICD-10-CM | POA: Diagnosis not present

## 2019-03-27 DIAGNOSIS — Z79899 Other long term (current) drug therapy: Secondary | ICD-10-CM | POA: Insufficient documentation

## 2019-03-27 DIAGNOSIS — Z87891 Personal history of nicotine dependence: Secondary | ICD-10-CM | POA: Diagnosis not present

## 2019-03-27 DIAGNOSIS — R1013 Epigastric pain: Secondary | ICD-10-CM | POA: Diagnosis not present

## 2019-03-27 DIAGNOSIS — E119 Type 2 diabetes mellitus without complications: Secondary | ICD-10-CM | POA: Insufficient documentation

## 2019-03-27 DIAGNOSIS — Z7984 Long term (current) use of oral hypoglycemic drugs: Secondary | ICD-10-CM | POA: Insufficient documentation

## 2019-03-27 DIAGNOSIS — R079 Chest pain, unspecified: Secondary | ICD-10-CM | POA: Diagnosis not present

## 2019-03-27 LAB — HEPATIC FUNCTION PANEL
ALT: 63 U/L — ABNORMAL HIGH (ref 0–44)
AST: 40 U/L (ref 15–41)
Albumin: 3.4 g/dL — ABNORMAL LOW (ref 3.5–5.0)
Alkaline Phosphatase: 72 U/L (ref 38–126)
Bilirubin, Direct: 0.2 mg/dL (ref 0.0–0.2)
Indirect Bilirubin: 0.5 mg/dL (ref 0.3–0.9)
Total Bilirubin: 0.7 mg/dL (ref 0.3–1.2)
Total Protein: 6.6 g/dL (ref 6.5–8.1)

## 2019-03-27 LAB — CBC
HCT: 48 % (ref 39.0–52.0)
Hemoglobin: 15.2 g/dL (ref 13.0–17.0)
MCH: 29 pg (ref 26.0–34.0)
MCHC: 31.7 g/dL (ref 30.0–36.0)
MCV: 91.4 fL (ref 80.0–100.0)
Platelets: 177 10*3/uL (ref 150–400)
RBC: 5.25 MIL/uL (ref 4.22–5.81)
RDW: 14 % (ref 11.5–15.5)
WBC: 4.7 10*3/uL (ref 4.0–10.5)
nRBC: 0 % (ref 0.0–0.2)

## 2019-03-27 LAB — D-DIMER, QUANTITATIVE (NOT AT ARMC): D-Dimer, Quant: 0.31 ug/mL-FEU (ref 0.00–0.50)

## 2019-03-27 LAB — BASIC METABOLIC PANEL
Anion gap: 10 (ref 5–15)
BUN: 16 mg/dL (ref 8–23)
CO2: 23 mmol/L (ref 22–32)
Calcium: 9.1 mg/dL (ref 8.9–10.3)
Chloride: 103 mmol/L (ref 98–111)
Creatinine, Ser: 1.22 mg/dL (ref 0.61–1.24)
GFR calc Af Amer: >60 mL/min (ref >60)
GFR calc non Af Amer: >60 mL/min (ref >60)
Glucose, Bld: 133 mg/dL — ABNORMAL HIGH (ref 70–99)
Potassium: 4.6 mmol/L (ref 3.5–5.1)
Sodium: 136 mmol/L (ref 135–145)

## 2019-03-27 LAB — TROPONIN I (HIGH SENSITIVITY)
Troponin I (High Sensitivity): <2 ng/L (ref <18)
Troponin I (High Sensitivity): <2 ng/L (ref <18)

## 2019-03-27 MED ORDER — SODIUM CHLORIDE 0.9% FLUSH: 3.0000 mL | Freq: Once | INTRAVENOUS | Status: DC

## 2019-03-27 NOTE — ED Triage Notes (Signed)
Pt here from home with c/o chest pain epigastric in nature, non radiating , no n/v , very slight sob , pt was covid positive 16 days ago
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
## 2019-03-27 NOTE — Discharge Instructions (Addendum)
Your work up was negative for evidence of a blood clot, heart attack or other emergent cause of your symptoms. You may add maalox or tylenol for your discomfort.   Your caregiver has diagnosed you as having chest pain that is not specific for one problem, but does not require admission.  You are at low risk for an acute heart condition or other serious illness. Chest pain comes from many different causes.  SEEK IMMEDIATE MEDICAL ATTENTION IF: You have severe chest pain, especially if the pain is crushing or pressure-like and spreads to the arms, back, neck, or jaw, or if you have sweating, nausea (feeling sick to your stomach), or shortness of breath. THIS IS AN EMERGENCY. Don't wait to see if the pain will go away. Get medical help at once. Call 911 or 0 (operator). DO NOT drive yourself to the hospital.  Your chest pain gets worse and does not go away with rest.  You have an attack of chest pain lasting longer than usual, despite rest and treatment with the medications your caregiver has prescribed.  You wake from sleep with chest pain or shortness of breath.  You feel dizzy or faint.  You have chest pain not typical of your usual pain for which you originally saw your caregiver.

## 2019-04-03 DIAGNOSIS — G4733 Obstructive sleep apnea (adult) (pediatric): Secondary | ICD-10-CM | POA: Diagnosis not present

## 2019-05-03 DIAGNOSIS — E119 Type 2 diabetes mellitus without complications: Secondary | ICD-10-CM | POA: Diagnosis not present

## 2019-05-03 DIAGNOSIS — I10 Essential (primary) hypertension: Secondary | ICD-10-CM | POA: Diagnosis not present

## 2019-05-03 DIAGNOSIS — E7800 Pure hypercholesterolemia, unspecified: Secondary | ICD-10-CM | POA: Diagnosis not present

## 2019-05-03 DIAGNOSIS — M1990 Unspecified osteoarthritis, unspecified site: Secondary | ICD-10-CM | POA: Diagnosis not present

## 2019-05-03 DIAGNOSIS — E1169 Type 2 diabetes mellitus with other specified complication: Secondary | ICD-10-CM | POA: Diagnosis not present

## 2019-05-04 DIAGNOSIS — G4733 Obstructive sleep apnea (adult) (pediatric): Secondary | ICD-10-CM | POA: Diagnosis not present

## 2019-05-15 DIAGNOSIS — M25571 Pain in right ankle and joints of right foot: Secondary | ICD-10-CM | POA: Diagnosis not present

## 2019-05-15 DIAGNOSIS — M79671 Pain in right foot: Secondary | ICD-10-CM | POA: Diagnosis not present

## 2019-06-01 DIAGNOSIS — G4733 Obstructive sleep apnea (adult) (pediatric): Secondary | ICD-10-CM | POA: Diagnosis not present

## 2019-07-03 DIAGNOSIS — M1990 Unspecified osteoarthritis, unspecified site: Secondary | ICD-10-CM | POA: Diagnosis not present

## 2019-07-03 DIAGNOSIS — E1169 Type 2 diabetes mellitus with other specified complication: Secondary | ICD-10-CM | POA: Diagnosis not present

## 2019-07-03 DIAGNOSIS — E7800 Pure hypercholesterolemia, unspecified: Secondary | ICD-10-CM | POA: Diagnosis not present

## 2019-07-03 DIAGNOSIS — E119 Type 2 diabetes mellitus without complications: Secondary | ICD-10-CM | POA: Diagnosis not present

## 2019-07-03 DIAGNOSIS — M62838 Other muscle spasm: Secondary | ICD-10-CM | POA: Diagnosis not present

## 2019-07-03 DIAGNOSIS — S8002XA Contusion of left knee, initial encounter: Secondary | ICD-10-CM | POA: Diagnosis not present

## 2019-07-03 DIAGNOSIS — I10 Essential (primary) hypertension: Secondary | ICD-10-CM | POA: Diagnosis not present

## 2019-07-04 DIAGNOSIS — M1712 Unilateral primary osteoarthritis, left knee: Secondary | ICD-10-CM | POA: Diagnosis not present

## 2019-07-04 DIAGNOSIS — M25562 Pain in left knee: Secondary | ICD-10-CM | POA: Diagnosis not present

## 2019-07-12 ENCOUNTER — Other Ambulatory Visit: Payer: Self-pay | Admitting: Orthopedic Surgery

## 2019-07-12 DIAGNOSIS — M25662 Stiffness of left knee, not elsewhere classified: Secondary | ICD-10-CM

## 2019-07-12 DIAGNOSIS — M25562 Pain in left knee: Secondary | ICD-10-CM

## 2019-07-12 DIAGNOSIS — M25462 Effusion, left knee: Secondary | ICD-10-CM

## 2019-07-19 DIAGNOSIS — X58XXXA Exposure to other specified factors, initial encounter: Secondary | ICD-10-CM | POA: Diagnosis not present

## 2019-07-19 DIAGNOSIS — S83242A Other tear of medial meniscus, current injury, left knee, initial encounter: Secondary | ICD-10-CM | POA: Diagnosis not present

## 2019-07-19 DIAGNOSIS — M25462 Effusion, left knee: Secondary | ICD-10-CM | POA: Diagnosis not present

## 2019-08-29 DIAGNOSIS — I10 Essential (primary) hypertension: Secondary | ICD-10-CM | POA: Diagnosis not present

## 2019-08-29 DIAGNOSIS — M1990 Unspecified osteoarthritis, unspecified site: Secondary | ICD-10-CM | POA: Diagnosis not present

## 2019-08-29 DIAGNOSIS — E7800 Pure hypercholesterolemia, unspecified: Secondary | ICD-10-CM | POA: Diagnosis not present

## 2019-08-29 DIAGNOSIS — E1169 Type 2 diabetes mellitus with other specified complication: Secondary | ICD-10-CM | POA: Diagnosis not present

## 2019-08-29 DIAGNOSIS — E119 Type 2 diabetes mellitus without complications: Secondary | ICD-10-CM | POA: Diagnosis not present

## 2019-10-30 DIAGNOSIS — M79672 Pain in left foot: Secondary | ICD-10-CM | POA: Diagnosis not present

## 2019-10-30 DIAGNOSIS — M67962 Unspecified disorder of synovium and tendon, left lower leg: Secondary | ICD-10-CM | POA: Diagnosis not present

## 2019-10-30 DIAGNOSIS — M25572 Pain in left ankle and joints of left foot: Secondary | ICD-10-CM | POA: Diagnosis not present

## 2019-11-08 DIAGNOSIS — I10 Essential (primary) hypertension: Secondary | ICD-10-CM | POA: Diagnosis not present

## 2019-11-08 DIAGNOSIS — E782 Mixed hyperlipidemia: Secondary | ICD-10-CM | POA: Diagnosis not present

## 2019-11-08 DIAGNOSIS — E7800 Pure hypercholesterolemia, unspecified: Secondary | ICD-10-CM | POA: Diagnosis not present

## 2019-11-08 DIAGNOSIS — E1169 Type 2 diabetes mellitus with other specified complication: Secondary | ICD-10-CM | POA: Diagnosis not present

## 2019-11-08 DIAGNOSIS — K219 Gastro-esophageal reflux disease without esophagitis: Secondary | ICD-10-CM | POA: Diagnosis not present

## 2019-11-08 DIAGNOSIS — M1990 Unspecified osteoarthritis, unspecified site: Secondary | ICD-10-CM | POA: Diagnosis not present

## 2019-11-22 DIAGNOSIS — K219 Gastro-esophageal reflux disease without esophagitis: Secondary | ICD-10-CM | POA: Diagnosis not present

## 2019-11-22 DIAGNOSIS — E1169 Type 2 diabetes mellitus with other specified complication: Secondary | ICD-10-CM | POA: Diagnosis not present

## 2019-11-22 DIAGNOSIS — I10 Essential (primary) hypertension: Secondary | ICD-10-CM | POA: Diagnosis not present

## 2019-11-22 DIAGNOSIS — E7800 Pure hypercholesterolemia, unspecified: Secondary | ICD-10-CM | POA: Diagnosis not present

## 2019-11-22 DIAGNOSIS — M1990 Unspecified osteoarthritis, unspecified site: Secondary | ICD-10-CM | POA: Diagnosis not present

## 2019-11-22 DIAGNOSIS — E782 Mixed hyperlipidemia: Secondary | ICD-10-CM | POA: Diagnosis not present

## 2019-12-06 DIAGNOSIS — E119 Type 2 diabetes mellitus without complications: Secondary | ICD-10-CM | POA: Diagnosis not present

## 2019-12-06 DIAGNOSIS — H5203 Hypermetropia, bilateral: Secondary | ICD-10-CM | POA: Diagnosis not present

## 2020-01-04 DIAGNOSIS — E1169 Type 2 diabetes mellitus with other specified complication: Secondary | ICD-10-CM | POA: Diagnosis not present

## 2020-01-04 DIAGNOSIS — E782 Mixed hyperlipidemia: Secondary | ICD-10-CM | POA: Diagnosis not present

## 2020-01-04 DIAGNOSIS — E119 Type 2 diabetes mellitus without complications: Secondary | ICD-10-CM | POA: Diagnosis not present

## 2020-01-04 DIAGNOSIS — I10 Essential (primary) hypertension: Secondary | ICD-10-CM | POA: Diagnosis not present

## 2020-01-04 DIAGNOSIS — E7800 Pure hypercholesterolemia, unspecified: Secondary | ICD-10-CM | POA: Diagnosis not present

## 2020-01-04 DIAGNOSIS — K219 Gastro-esophageal reflux disease without esophagitis: Secondary | ICD-10-CM | POA: Diagnosis not present

## 2020-01-04 DIAGNOSIS — M1990 Unspecified osteoarthritis, unspecified site: Secondary | ICD-10-CM | POA: Diagnosis not present

## 2020-01-16 DIAGNOSIS — Z23 Encounter for immunization: Secondary | ICD-10-CM | POA: Diagnosis not present

## 2020-01-16 DIAGNOSIS — E119 Type 2 diabetes mellitus without complications: Secondary | ICD-10-CM | POA: Diagnosis not present

## 2020-01-16 DIAGNOSIS — E7800 Pure hypercholesterolemia, unspecified: Secondary | ICD-10-CM | POA: Diagnosis not present

## 2020-01-16 DIAGNOSIS — Z125 Encounter for screening for malignant neoplasm of prostate: Secondary | ICD-10-CM | POA: Diagnosis not present

## 2020-01-16 DIAGNOSIS — M1990 Unspecified osteoarthritis, unspecified site: Secondary | ICD-10-CM | POA: Diagnosis not present

## 2020-01-16 DIAGNOSIS — E1169 Type 2 diabetes mellitus with other specified complication: Secondary | ICD-10-CM | POA: Diagnosis not present

## 2020-01-16 DIAGNOSIS — I10 Essential (primary) hypertension: Secondary | ICD-10-CM | POA: Diagnosis not present

## 2020-01-16 DIAGNOSIS — Z5181 Encounter for therapeutic drug level monitoring: Secondary | ICD-10-CM | POA: Diagnosis not present

## 2020-01-16 DIAGNOSIS — N529 Male erectile dysfunction, unspecified: Secondary | ICD-10-CM | POA: Diagnosis not present

## 2020-01-16 DIAGNOSIS — R7989 Other specified abnormal findings of blood chemistry: Secondary | ICD-10-CM | POA: Diagnosis not present

## 2020-01-24 DIAGNOSIS — L570 Actinic keratosis: Secondary | ICD-10-CM | POA: Diagnosis not present

## 2020-01-24 DIAGNOSIS — L578 Other skin changes due to chronic exposure to nonionizing radiation: Secondary | ICD-10-CM | POA: Diagnosis not present

## 2020-01-24 DIAGNOSIS — D1801 Hemangioma of skin and subcutaneous tissue: Secondary | ICD-10-CM | POA: Diagnosis not present

## 2020-01-24 DIAGNOSIS — L821 Other seborrheic keratosis: Secondary | ICD-10-CM | POA: Diagnosis not present

## 2020-01-24 DIAGNOSIS — R600 Localized edema: Secondary | ICD-10-CM | POA: Diagnosis not present

## 2020-01-24 DIAGNOSIS — L568 Other specified acute skin changes due to ultraviolet radiation: Secondary | ICD-10-CM | POA: Diagnosis not present

## 2020-01-24 DIAGNOSIS — L814 Other melanin hyperpigmentation: Secondary | ICD-10-CM | POA: Diagnosis not present

## 2020-01-30 DIAGNOSIS — M1990 Unspecified osteoarthritis, unspecified site: Secondary | ICD-10-CM | POA: Diagnosis not present

## 2020-01-30 DIAGNOSIS — K219 Gastro-esophageal reflux disease without esophagitis: Secondary | ICD-10-CM | POA: Diagnosis not present

## 2020-01-30 DIAGNOSIS — E119 Type 2 diabetes mellitus without complications: Secondary | ICD-10-CM | POA: Diagnosis not present

## 2020-01-30 DIAGNOSIS — E782 Mixed hyperlipidemia: Secondary | ICD-10-CM | POA: Diagnosis not present

## 2020-01-30 DIAGNOSIS — E1169 Type 2 diabetes mellitus with other specified complication: Secondary | ICD-10-CM | POA: Diagnosis not present

## 2020-01-30 DIAGNOSIS — E7800 Pure hypercholesterolemia, unspecified: Secondary | ICD-10-CM | POA: Diagnosis not present

## 2020-01-30 DIAGNOSIS — I10 Essential (primary) hypertension: Secondary | ICD-10-CM | POA: Diagnosis not present

## 2020-02-06 DIAGNOSIS — M5440 Lumbago with sciatica, unspecified side: Secondary | ICD-10-CM | POA: Diagnosis not present

## 2020-02-06 DIAGNOSIS — E782 Mixed hyperlipidemia: Secondary | ICD-10-CM | POA: Diagnosis not present

## 2020-02-06 DIAGNOSIS — E1169 Type 2 diabetes mellitus with other specified complication: Secondary | ICD-10-CM | POA: Diagnosis not present

## 2020-02-06 DIAGNOSIS — I10 Essential (primary) hypertension: Secondary | ICD-10-CM | POA: Diagnosis not present

## 2020-02-06 DIAGNOSIS — K219 Gastro-esophageal reflux disease without esophagitis: Secondary | ICD-10-CM | POA: Diagnosis not present

## 2020-02-06 DIAGNOSIS — Z7984 Long term (current) use of oral hypoglycemic drugs: Secondary | ICD-10-CM | POA: Diagnosis not present

## 2020-02-06 DIAGNOSIS — G250 Essential tremor: Secondary | ICD-10-CM | POA: Diagnosis not present

## 2020-02-06 DIAGNOSIS — G4730 Sleep apnea, unspecified: Secondary | ICD-10-CM | POA: Diagnosis not present

## 2020-03-26 DIAGNOSIS — M1990 Unspecified osteoarthritis, unspecified site: Secondary | ICD-10-CM | POA: Diagnosis not present

## 2020-03-26 DIAGNOSIS — E1169 Type 2 diabetes mellitus with other specified complication: Secondary | ICD-10-CM | POA: Diagnosis not present

## 2020-03-26 DIAGNOSIS — E782 Mixed hyperlipidemia: Secondary | ICD-10-CM | POA: Diagnosis not present

## 2020-03-26 DIAGNOSIS — K219 Gastro-esophageal reflux disease without esophagitis: Secondary | ICD-10-CM | POA: Diagnosis not present

## 2020-03-26 DIAGNOSIS — I10 Essential (primary) hypertension: Secondary | ICD-10-CM | POA: Diagnosis not present

## 2020-03-26 DIAGNOSIS — E7800 Pure hypercholesterolemia, unspecified: Secondary | ICD-10-CM | POA: Diagnosis not present

## 2020-03-26 DIAGNOSIS — E119 Type 2 diabetes mellitus without complications: Secondary | ICD-10-CM | POA: Diagnosis not present

## 2020-04-22 DIAGNOSIS — E1169 Type 2 diabetes mellitus with other specified complication: Secondary | ICD-10-CM | POA: Diagnosis not present

## 2020-04-22 DIAGNOSIS — G4730 Sleep apnea, unspecified: Secondary | ICD-10-CM | POA: Diagnosis not present

## 2020-04-22 DIAGNOSIS — M5440 Lumbago with sciatica, unspecified side: Secondary | ICD-10-CM | POA: Diagnosis not present

## 2020-04-22 DIAGNOSIS — K219 Gastro-esophageal reflux disease without esophagitis: Secondary | ICD-10-CM | POA: Diagnosis not present

## 2020-04-22 DIAGNOSIS — G250 Essential tremor: Secondary | ICD-10-CM | POA: Diagnosis not present

## 2020-04-22 DIAGNOSIS — E782 Mixed hyperlipidemia: Secondary | ICD-10-CM | POA: Diagnosis not present

## 2020-04-23 DIAGNOSIS — S20212A Contusion of left front wall of thorax, initial encounter: Secondary | ICD-10-CM | POA: Diagnosis not present

## 2020-04-23 DIAGNOSIS — G4730 Sleep apnea, unspecified: Secondary | ICD-10-CM | POA: Diagnosis not present

## 2020-04-23 DIAGNOSIS — E782 Mixed hyperlipidemia: Secondary | ICD-10-CM | POA: Diagnosis not present

## 2020-04-23 DIAGNOSIS — Z7984 Long term (current) use of oral hypoglycemic drugs: Secondary | ICD-10-CM | POA: Diagnosis not present

## 2020-04-23 DIAGNOSIS — B351 Tinea unguium: Secondary | ICD-10-CM | POA: Diagnosis not present

## 2020-04-23 DIAGNOSIS — G250 Essential tremor: Secondary | ICD-10-CM | POA: Diagnosis not present

## 2020-04-23 DIAGNOSIS — E1169 Type 2 diabetes mellitus with other specified complication: Secondary | ICD-10-CM | POA: Diagnosis not present

## 2020-04-23 DIAGNOSIS — K219 Gastro-esophageal reflux disease without esophagitis: Secondary | ICD-10-CM | POA: Diagnosis not present

## 2020-04-23 DIAGNOSIS — I10 Essential (primary) hypertension: Secondary | ICD-10-CM | POA: Diagnosis not present

## 2020-04-24 DIAGNOSIS — H2511 Age-related nuclear cataract, right eye: Secondary | ICD-10-CM | POA: Diagnosis not present

## 2020-04-24 DIAGNOSIS — H2513 Age-related nuclear cataract, bilateral: Secondary | ICD-10-CM | POA: Diagnosis not present

## 2020-04-24 DIAGNOSIS — H18613 Keratoconus, stable, bilateral: Secondary | ICD-10-CM | POA: Diagnosis not present

## 2020-04-24 DIAGNOSIS — E119 Type 2 diabetes mellitus without complications: Secondary | ICD-10-CM | POA: Diagnosis not present

## 2020-04-24 DIAGNOSIS — H25043 Posterior subcapsular polar age-related cataract, bilateral: Secondary | ICD-10-CM | POA: Diagnosis not present

## 2020-05-21 DIAGNOSIS — H2511 Age-related nuclear cataract, right eye: Secondary | ICD-10-CM | POA: Diagnosis not present

## 2020-05-21 DIAGNOSIS — H2512 Age-related nuclear cataract, left eye: Secondary | ICD-10-CM | POA: Diagnosis not present

## 2020-05-21 DIAGNOSIS — H25811 Combined forms of age-related cataract, right eye: Secondary | ICD-10-CM | POA: Diagnosis not present

## 2020-05-21 DIAGNOSIS — H25041 Posterior subcapsular polar age-related cataract, right eye: Secondary | ICD-10-CM | POA: Diagnosis not present

## 2020-05-29 DIAGNOSIS — E1169 Type 2 diabetes mellitus with other specified complication: Secondary | ICD-10-CM | POA: Diagnosis not present

## 2020-05-29 DIAGNOSIS — E119 Type 2 diabetes mellitus without complications: Secondary | ICD-10-CM | POA: Diagnosis not present

## 2020-05-29 DIAGNOSIS — I10 Essential (primary) hypertension: Secondary | ICD-10-CM | POA: Diagnosis not present

## 2020-05-29 DIAGNOSIS — E782 Mixed hyperlipidemia: Secondary | ICD-10-CM | POA: Diagnosis not present

## 2020-05-29 DIAGNOSIS — M1990 Unspecified osteoarthritis, unspecified site: Secondary | ICD-10-CM | POA: Diagnosis not present

## 2020-05-29 DIAGNOSIS — E7800 Pure hypercholesterolemia, unspecified: Secondary | ICD-10-CM | POA: Diagnosis not present

## 2020-05-29 DIAGNOSIS — K219 Gastro-esophageal reflux disease without esophagitis: Secondary | ICD-10-CM | POA: Diagnosis not present

## 2020-07-12 DIAGNOSIS — E1169 Type 2 diabetes mellitus with other specified complication: Secondary | ICD-10-CM | POA: Diagnosis not present

## 2020-07-12 DIAGNOSIS — Z0000 Encounter for general adult medical examination without abnormal findings: Secondary | ICD-10-CM | POA: Diagnosis not present

## 2020-07-12 DIAGNOSIS — Z23 Encounter for immunization: Secondary | ICD-10-CM | POA: Diagnosis not present

## 2020-07-12 DIAGNOSIS — Z5181 Encounter for therapeutic drug level monitoring: Secondary | ICD-10-CM | POA: Diagnosis not present

## 2020-07-12 DIAGNOSIS — I10 Essential (primary) hypertension: Secondary | ICD-10-CM | POA: Diagnosis not present

## 2020-07-12 DIAGNOSIS — M1990 Unspecified osteoarthritis, unspecified site: Secondary | ICD-10-CM | POA: Diagnosis not present

## 2020-07-12 DIAGNOSIS — E782 Mixed hyperlipidemia: Secondary | ICD-10-CM | POA: Diagnosis not present

## 2020-07-12 DIAGNOSIS — K219 Gastro-esophageal reflux disease without esophagitis: Secondary | ICD-10-CM | POA: Diagnosis not present

## 2020-08-02 DIAGNOSIS — I10 Essential (primary) hypertension: Secondary | ICD-10-CM | POA: Diagnosis not present

## 2020-08-02 DIAGNOSIS — E782 Mixed hyperlipidemia: Secondary | ICD-10-CM | POA: Diagnosis not present

## 2020-08-02 DIAGNOSIS — K219 Gastro-esophageal reflux disease without esophagitis: Secondary | ICD-10-CM | POA: Diagnosis not present

## 2020-08-02 DIAGNOSIS — M1990 Unspecified osteoarthritis, unspecified site: Secondary | ICD-10-CM | POA: Diagnosis not present

## 2020-08-02 DIAGNOSIS — E1169 Type 2 diabetes mellitus with other specified complication: Secondary | ICD-10-CM | POA: Diagnosis not present

## 2020-08-26 DIAGNOSIS — Z5181 Encounter for therapeutic drug level monitoring: Secondary | ICD-10-CM | POA: Diagnosis not present

## 2020-08-26 DIAGNOSIS — K219 Gastro-esophageal reflux disease without esophagitis: Secondary | ICD-10-CM | POA: Diagnosis not present

## 2020-08-26 DIAGNOSIS — E1169 Type 2 diabetes mellitus with other specified complication: Secondary | ICD-10-CM | POA: Diagnosis not present

## 2020-08-26 DIAGNOSIS — E782 Mixed hyperlipidemia: Secondary | ICD-10-CM | POA: Diagnosis not present

## 2020-08-26 DIAGNOSIS — M1990 Unspecified osteoarthritis, unspecified site: Secondary | ICD-10-CM | POA: Diagnosis not present

## 2020-08-26 DIAGNOSIS — Z23 Encounter for immunization: Secondary | ICD-10-CM | POA: Diagnosis not present

## 2020-08-26 DIAGNOSIS — I10 Essential (primary) hypertension: Secondary | ICD-10-CM | POA: Diagnosis not present

## 2020-09-13 DIAGNOSIS — G4733 Obstructive sleep apnea (adult) (pediatric): Secondary | ICD-10-CM | POA: Diagnosis not present

## 2020-10-08 DIAGNOSIS — L820 Inflamed seborrheic keratosis: Secondary | ICD-10-CM | POA: Diagnosis not present

## 2020-10-08 DIAGNOSIS — D485 Neoplasm of uncertain behavior of skin: Secondary | ICD-10-CM | POA: Diagnosis not present

## 2020-10-08 DIAGNOSIS — L570 Actinic keratosis: Secondary | ICD-10-CM | POA: Diagnosis not present

## 2020-10-08 DIAGNOSIS — D0439 Carcinoma in situ of skin of other parts of face: Secondary | ICD-10-CM | POA: Diagnosis not present

## 2020-10-08 DIAGNOSIS — L821 Other seborrheic keratosis: Secondary | ICD-10-CM | POA: Diagnosis not present

## 2020-10-14 DIAGNOSIS — G4733 Obstructive sleep apnea (adult) (pediatric): Secondary | ICD-10-CM | POA: Diagnosis not present

## 2020-10-22 DIAGNOSIS — D0439 Carcinoma in situ of skin of other parts of face: Secondary | ICD-10-CM | POA: Diagnosis not present

## 2020-10-23 ENCOUNTER — Encounter: Payer: Self-pay | Admitting: Gastroenterology

## 2020-10-31 DIAGNOSIS — M169 Osteoarthritis of hip, unspecified: Secondary | ICD-10-CM | POA: Diagnosis not present

## 2020-10-31 DIAGNOSIS — M25551 Pain in right hip: Secondary | ICD-10-CM | POA: Diagnosis not present

## 2020-11-04 DIAGNOSIS — G8929 Other chronic pain: Secondary | ICD-10-CM | POA: Diagnosis not present

## 2020-11-04 DIAGNOSIS — Z6836 Body mass index (BMI) 36.0-36.9, adult: Secondary | ICD-10-CM | POA: Diagnosis not present

## 2020-11-04 DIAGNOSIS — G4733 Obstructive sleep apnea (adult) (pediatric): Secondary | ICD-10-CM | POA: Diagnosis not present

## 2020-11-04 DIAGNOSIS — M1990 Unspecified osteoarthritis, unspecified site: Secondary | ICD-10-CM | POA: Diagnosis not present

## 2020-11-04 DIAGNOSIS — I10 Essential (primary) hypertension: Secondary | ICD-10-CM | POA: Diagnosis not present

## 2020-11-04 DIAGNOSIS — K219 Gastro-esophageal reflux disease without esophagitis: Secondary | ICD-10-CM | POA: Diagnosis not present

## 2020-11-04 DIAGNOSIS — Z7722 Contact with and (suspected) exposure to environmental tobacco smoke (acute) (chronic): Secondary | ICD-10-CM | POA: Diagnosis not present

## 2020-11-04 DIAGNOSIS — E1165 Type 2 diabetes mellitus with hyperglycemia: Secondary | ICD-10-CM | POA: Diagnosis not present

## 2020-11-04 DIAGNOSIS — E785 Hyperlipidemia, unspecified: Secondary | ICD-10-CM | POA: Diagnosis not present

## 2020-11-07 DIAGNOSIS — M169 Osteoarthritis of hip, unspecified: Secondary | ICD-10-CM | POA: Diagnosis not present

## 2020-11-07 DIAGNOSIS — M1611 Unilateral primary osteoarthritis, right hip: Secondary | ICD-10-CM | POA: Diagnosis not present

## 2020-11-14 DIAGNOSIS — G4733 Obstructive sleep apnea (adult) (pediatric): Secondary | ICD-10-CM | POA: Diagnosis not present

## 2020-11-20 DIAGNOSIS — K219 Gastro-esophageal reflux disease without esophagitis: Secondary | ICD-10-CM | POA: Diagnosis not present

## 2020-11-20 DIAGNOSIS — E1169 Type 2 diabetes mellitus with other specified complication: Secondary | ICD-10-CM | POA: Diagnosis not present

## 2020-11-20 DIAGNOSIS — M1990 Unspecified osteoarthritis, unspecified site: Secondary | ICD-10-CM | POA: Diagnosis not present

## 2020-11-20 DIAGNOSIS — E782 Mixed hyperlipidemia: Secondary | ICD-10-CM | POA: Diagnosis not present

## 2020-11-20 DIAGNOSIS — I10 Essential (primary) hypertension: Secondary | ICD-10-CM | POA: Diagnosis not present

## 2020-11-27 DIAGNOSIS — M7061 Trochanteric bursitis, right hip: Secondary | ICD-10-CM | POA: Diagnosis not present

## 2020-12-02 DIAGNOSIS — M7061 Trochanteric bursitis, right hip: Secondary | ICD-10-CM | POA: Diagnosis not present

## 2020-12-23 DIAGNOSIS — M545 Low back pain, unspecified: Secondary | ICD-10-CM | POA: Diagnosis not present

## 2020-12-23 DIAGNOSIS — M5416 Radiculopathy, lumbar region: Secondary | ICD-10-CM | POA: Diagnosis not present

## 2020-12-23 DIAGNOSIS — M25551 Pain in right hip: Secondary | ICD-10-CM | POA: Diagnosis not present

## 2020-12-23 DIAGNOSIS — M5136 Other intervertebral disc degeneration, lumbar region: Secondary | ICD-10-CM | POA: Diagnosis not present

## 2020-12-30 DIAGNOSIS — E1169 Type 2 diabetes mellitus with other specified complication: Secondary | ICD-10-CM | POA: Diagnosis not present

## 2020-12-30 DIAGNOSIS — Z125 Encounter for screening for malignant neoplasm of prostate: Secondary | ICD-10-CM | POA: Diagnosis not present

## 2020-12-30 DIAGNOSIS — E7800 Pure hypercholesterolemia, unspecified: Secondary | ICD-10-CM | POA: Diagnosis not present

## 2020-12-31 DIAGNOSIS — M545 Low back pain, unspecified: Secondary | ICD-10-CM | POA: Diagnosis not present

## 2020-12-31 DIAGNOSIS — Z5181 Encounter for therapeutic drug level monitoring: Secondary | ICD-10-CM | POA: Diagnosis not present

## 2020-12-31 DIAGNOSIS — Z23 Encounter for immunization: Secondary | ICD-10-CM | POA: Diagnosis not present

## 2020-12-31 DIAGNOSIS — I10 Essential (primary) hypertension: Secondary | ICD-10-CM | POA: Diagnosis not present

## 2020-12-31 DIAGNOSIS — N529 Male erectile dysfunction, unspecified: Secondary | ICD-10-CM | POA: Diagnosis not present

## 2020-12-31 DIAGNOSIS — E1169 Type 2 diabetes mellitus with other specified complication: Secondary | ICD-10-CM | POA: Diagnosis not present

## 2020-12-31 DIAGNOSIS — E7800 Pure hypercholesterolemia, unspecified: Secondary | ICD-10-CM | POA: Diagnosis not present

## 2021-01-03 DIAGNOSIS — M47896 Other spondylosis, lumbar region: Secondary | ICD-10-CM | POA: Diagnosis not present

## 2021-01-03 DIAGNOSIS — M545 Low back pain, unspecified: Secondary | ICD-10-CM | POA: Diagnosis not present

## 2021-01-03 DIAGNOSIS — M25551 Pain in right hip: Secondary | ICD-10-CM | POA: Diagnosis not present

## 2021-01-03 DIAGNOSIS — R262 Difficulty in walking, not elsewhere classified: Secondary | ICD-10-CM | POA: Diagnosis not present

## 2021-01-06 DIAGNOSIS — R262 Difficulty in walking, not elsewhere classified: Secondary | ICD-10-CM | POA: Diagnosis not present

## 2021-01-06 DIAGNOSIS — M545 Low back pain, unspecified: Secondary | ICD-10-CM | POA: Diagnosis not present

## 2021-01-06 DIAGNOSIS — M25551 Pain in right hip: Secondary | ICD-10-CM | POA: Diagnosis not present

## 2021-01-06 DIAGNOSIS — M47896 Other spondylosis, lumbar region: Secondary | ICD-10-CM | POA: Diagnosis not present

## 2021-01-08 DIAGNOSIS — R262 Difficulty in walking, not elsewhere classified: Secondary | ICD-10-CM | POA: Diagnosis not present

## 2021-01-08 DIAGNOSIS — M545 Low back pain, unspecified: Secondary | ICD-10-CM | POA: Diagnosis not present

## 2021-01-08 DIAGNOSIS — M25551 Pain in right hip: Secondary | ICD-10-CM | POA: Diagnosis not present

## 2021-01-08 DIAGNOSIS — M47896 Other spondylosis, lumbar region: Secondary | ICD-10-CM | POA: Diagnosis not present

## 2021-01-13 DIAGNOSIS — M25551 Pain in right hip: Secondary | ICD-10-CM | POA: Diagnosis not present

## 2021-01-13 DIAGNOSIS — M47896 Other spondylosis, lumbar region: Secondary | ICD-10-CM | POA: Diagnosis not present

## 2021-01-13 DIAGNOSIS — M545 Low back pain, unspecified: Secondary | ICD-10-CM | POA: Diagnosis not present

## 2021-01-13 DIAGNOSIS — R262 Difficulty in walking, not elsewhere classified: Secondary | ICD-10-CM | POA: Diagnosis not present

## 2021-01-15 DIAGNOSIS — M25551 Pain in right hip: Secondary | ICD-10-CM | POA: Diagnosis not present

## 2021-01-15 DIAGNOSIS — M545 Low back pain, unspecified: Secondary | ICD-10-CM | POA: Diagnosis not present

## 2021-01-15 DIAGNOSIS — R262 Difficulty in walking, not elsewhere classified: Secondary | ICD-10-CM | POA: Diagnosis not present

## 2021-01-15 DIAGNOSIS — M47896 Other spondylosis, lumbar region: Secondary | ICD-10-CM | POA: Diagnosis not present

## 2021-01-20 DIAGNOSIS — M47896 Other spondylosis, lumbar region: Secondary | ICD-10-CM | POA: Diagnosis not present

## 2021-01-20 DIAGNOSIS — M545 Low back pain, unspecified: Secondary | ICD-10-CM | POA: Diagnosis not present

## 2021-01-20 DIAGNOSIS — R262 Difficulty in walking, not elsewhere classified: Secondary | ICD-10-CM | POA: Diagnosis not present

## 2021-01-20 DIAGNOSIS — M25551 Pain in right hip: Secondary | ICD-10-CM | POA: Diagnosis not present

## 2021-01-22 DIAGNOSIS — R262 Difficulty in walking, not elsewhere classified: Secondary | ICD-10-CM | POA: Diagnosis not present

## 2021-01-22 DIAGNOSIS — M545 Low back pain, unspecified: Secondary | ICD-10-CM | POA: Diagnosis not present

## 2021-01-22 DIAGNOSIS — M25551 Pain in right hip: Secondary | ICD-10-CM | POA: Diagnosis not present

## 2021-01-22 DIAGNOSIS — M47896 Other spondylosis, lumbar region: Secondary | ICD-10-CM | POA: Diagnosis not present

## 2021-01-24 DIAGNOSIS — R262 Difficulty in walking, not elsewhere classified: Secondary | ICD-10-CM | POA: Diagnosis not present

## 2021-01-24 DIAGNOSIS — M545 Low back pain, unspecified: Secondary | ICD-10-CM | POA: Diagnosis not present

## 2021-01-24 DIAGNOSIS — M25551 Pain in right hip: Secondary | ICD-10-CM | POA: Diagnosis not present

## 2021-01-24 DIAGNOSIS — M47896 Other spondylosis, lumbar region: Secondary | ICD-10-CM | POA: Diagnosis not present

## 2021-02-10 DIAGNOSIS — M25551 Pain in right hip: Secondary | ICD-10-CM | POA: Diagnosis not present

## 2021-02-10 DIAGNOSIS — M545 Low back pain, unspecified: Secondary | ICD-10-CM | POA: Diagnosis not present

## 2021-02-10 DIAGNOSIS — M5416 Radiculopathy, lumbar region: Secondary | ICD-10-CM | POA: Diagnosis not present

## 2021-02-10 DIAGNOSIS — M5136 Other intervertebral disc degeneration, lumbar region: Secondary | ICD-10-CM | POA: Diagnosis not present

## 2021-02-17 DIAGNOSIS — M5416 Radiculopathy, lumbar region: Secondary | ICD-10-CM | POA: Diagnosis not present

## 2021-02-19 DIAGNOSIS — M5136 Other intervertebral disc degeneration, lumbar region: Secondary | ICD-10-CM | POA: Diagnosis not present

## 2021-02-19 DIAGNOSIS — M48061 Spinal stenosis, lumbar region without neurogenic claudication: Secondary | ICD-10-CM | POA: Diagnosis not present

## 2021-02-19 DIAGNOSIS — M5416 Radiculopathy, lumbar region: Secondary | ICD-10-CM | POA: Diagnosis not present

## 2021-02-19 DIAGNOSIS — M5116 Intervertebral disc disorders with radiculopathy, lumbar region: Secondary | ICD-10-CM | POA: Diagnosis not present

## 2021-02-19 DIAGNOSIS — M545 Low back pain, unspecified: Secondary | ICD-10-CM | POA: Diagnosis not present

## 2021-03-03 DIAGNOSIS — M5416 Radiculopathy, lumbar region: Secondary | ICD-10-CM | POA: Diagnosis not present

## 2021-03-14 DIAGNOSIS — I10 Essential (primary) hypertension: Secondary | ICD-10-CM | POA: Diagnosis not present

## 2021-03-14 DIAGNOSIS — K219 Gastro-esophageal reflux disease without esophagitis: Secondary | ICD-10-CM | POA: Diagnosis not present

## 2021-03-14 DIAGNOSIS — E1169 Type 2 diabetes mellitus with other specified complication: Secondary | ICD-10-CM | POA: Diagnosis not present

## 2021-03-14 DIAGNOSIS — E782 Mixed hyperlipidemia: Secondary | ICD-10-CM | POA: Diagnosis not present

## 2021-03-14 DIAGNOSIS — E7800 Pure hypercholesterolemia, unspecified: Secondary | ICD-10-CM | POA: Diagnosis not present

## 2021-03-14 DIAGNOSIS — M1990 Unspecified osteoarthritis, unspecified site: Secondary | ICD-10-CM | POA: Diagnosis not present

## 2021-03-18 DIAGNOSIS — M5416 Radiculopathy, lumbar region: Secondary | ICD-10-CM | POA: Diagnosis not present

## 2021-03-18 DIAGNOSIS — M48061 Spinal stenosis, lumbar region without neurogenic claudication: Secondary | ICD-10-CM | POA: Diagnosis not present

## 2021-03-18 DIAGNOSIS — M545 Low back pain, unspecified: Secondary | ICD-10-CM | POA: Diagnosis not present

## 2021-03-18 DIAGNOSIS — M5116 Intervertebral disc disorders with radiculopathy, lumbar region: Secondary | ICD-10-CM | POA: Diagnosis not present

## 2021-03-18 DIAGNOSIS — M5136 Other intervertebral disc degeneration, lumbar region: Secondary | ICD-10-CM | POA: Diagnosis not present

## 2021-04-07 DIAGNOSIS — K5730 Diverticulosis of large intestine without perforation or abscess without bleeding: Secondary | ICD-10-CM | POA: Diagnosis not present

## 2021-04-07 DIAGNOSIS — Z86010 Personal history of colonic polyps: Secondary | ICD-10-CM | POA: Diagnosis not present

## 2021-04-07 DIAGNOSIS — Z1211 Encounter for screening for malignant neoplasm of colon: Secondary | ICD-10-CM | POA: Diagnosis not present

## 2021-04-07 DIAGNOSIS — Z800 Family history of malignant neoplasm of digestive organs: Secondary | ICD-10-CM | POA: Diagnosis not present

## 2021-04-08 DIAGNOSIS — M5417 Radiculopathy, lumbosacral region: Secondary | ICD-10-CM | POA: Diagnosis not present

## 2021-04-08 DIAGNOSIS — G8929 Other chronic pain: Secondary | ICD-10-CM | POA: Diagnosis not present

## 2021-04-15 DIAGNOSIS — M5136 Other intervertebral disc degeneration, lumbar region: Secondary | ICD-10-CM | POA: Diagnosis not present

## 2021-04-15 DIAGNOSIS — M5416 Radiculopathy, lumbar region: Secondary | ICD-10-CM | POA: Diagnosis not present

## 2021-04-15 DIAGNOSIS — M48061 Spinal stenosis, lumbar region without neurogenic claudication: Secondary | ICD-10-CM | POA: Diagnosis not present

## 2021-04-15 DIAGNOSIS — M545 Low back pain, unspecified: Secondary | ICD-10-CM | POA: Diagnosis not present

## 2021-04-15 DIAGNOSIS — M47816 Spondylosis without myelopathy or radiculopathy, lumbar region: Secondary | ICD-10-CM | POA: Diagnosis not present

## 2021-04-15 DIAGNOSIS — M5116 Intervertebral disc disorders with radiculopathy, lumbar region: Secondary | ICD-10-CM | POA: Diagnosis not present

## 2021-04-25 DIAGNOSIS — M25551 Pain in right hip: Secondary | ICD-10-CM | POA: Diagnosis not present

## 2021-04-28 DIAGNOSIS — I10 Essential (primary) hypertension: Secondary | ICD-10-CM | POA: Diagnosis not present

## 2021-04-28 DIAGNOSIS — E1169 Type 2 diabetes mellitus with other specified complication: Secondary | ICD-10-CM | POA: Diagnosis not present

## 2021-04-28 DIAGNOSIS — M1990 Unspecified osteoarthritis, unspecified site: Secondary | ICD-10-CM | POA: Diagnosis not present

## 2021-04-28 DIAGNOSIS — G252 Other specified forms of tremor: Secondary | ICD-10-CM | POA: Diagnosis not present

## 2021-04-28 DIAGNOSIS — K219 Gastro-esophageal reflux disease without esophagitis: Secondary | ICD-10-CM | POA: Diagnosis not present

## 2021-04-28 DIAGNOSIS — E782 Mixed hyperlipidemia: Secondary | ICD-10-CM | POA: Diagnosis not present

## 2021-04-28 DIAGNOSIS — E7800 Pure hypercholesterolemia, unspecified: Secondary | ICD-10-CM | POA: Diagnosis not present

## 2021-04-29 DIAGNOSIS — M1611 Unilateral primary osteoarthritis, right hip: Secondary | ICD-10-CM | POA: Diagnosis not present

## 2021-05-19 DIAGNOSIS — Z961 Presence of intraocular lens: Secondary | ICD-10-CM | POA: Diagnosis not present

## 2021-05-19 DIAGNOSIS — E119 Type 2 diabetes mellitus without complications: Secondary | ICD-10-CM | POA: Diagnosis not present

## 2021-05-19 DIAGNOSIS — H2511 Age-related nuclear cataract, right eye: Secondary | ICD-10-CM | POA: Diagnosis not present

## 2021-05-19 DIAGNOSIS — H25041 Posterior subcapsular polar age-related cataract, right eye: Secondary | ICD-10-CM | POA: Diagnosis not present

## 2021-05-20 DIAGNOSIS — M25651 Stiffness of right hip, not elsewhere classified: Secondary | ICD-10-CM | POA: Diagnosis not present

## 2021-05-20 DIAGNOSIS — E782 Mixed hyperlipidemia: Secondary | ICD-10-CM | POA: Diagnosis not present

## 2021-05-20 DIAGNOSIS — M1990 Unspecified osteoarthritis, unspecified site: Secondary | ICD-10-CM | POA: Diagnosis not present

## 2021-05-20 DIAGNOSIS — K219 Gastro-esophageal reflux disease without esophagitis: Secondary | ICD-10-CM | POA: Diagnosis not present

## 2021-05-20 DIAGNOSIS — E1169 Type 2 diabetes mellitus with other specified complication: Secondary | ICD-10-CM | POA: Diagnosis not present

## 2021-05-20 DIAGNOSIS — I10 Essential (primary) hypertension: Secondary | ICD-10-CM | POA: Diagnosis not present

## 2021-05-20 DIAGNOSIS — M1611 Unilateral primary osteoarthritis, right hip: Secondary | ICD-10-CM | POA: Diagnosis not present

## 2021-05-20 DIAGNOSIS — G252 Other specified forms of tremor: Secondary | ICD-10-CM | POA: Diagnosis not present

## 2021-05-20 DIAGNOSIS — E7800 Pure hypercholesterolemia, unspecified: Secondary | ICD-10-CM | POA: Diagnosis not present

## 2021-05-20 DIAGNOSIS — Z01818 Encounter for other preprocedural examination: Secondary | ICD-10-CM | POA: Diagnosis not present

## 2021-05-20 DIAGNOSIS — M25551 Pain in right hip: Secondary | ICD-10-CM | POA: Diagnosis not present

## 2021-05-30 DIAGNOSIS — S73031A Other anterior subluxation of right hip, initial encounter: Secondary | ICD-10-CM | POA: Diagnosis not present

## 2021-05-30 DIAGNOSIS — Z96641 Presence of right artificial hip joint: Secondary | ICD-10-CM | POA: Diagnosis not present

## 2021-05-30 DIAGNOSIS — M1611 Unilateral primary osteoarthritis, right hip: Secondary | ICD-10-CM | POA: Diagnosis not present

## 2021-06-02 DIAGNOSIS — Z96641 Presence of right artificial hip joint: Secondary | ICD-10-CM | POA: Diagnosis not present

## 2021-06-02 DIAGNOSIS — M25651 Stiffness of right hip, not elsewhere classified: Secondary | ICD-10-CM | POA: Diagnosis not present

## 2021-06-02 DIAGNOSIS — R531 Weakness: Secondary | ICD-10-CM | POA: Diagnosis not present

## 2021-06-02 DIAGNOSIS — R262 Difficulty in walking, not elsewhere classified: Secondary | ICD-10-CM | POA: Diagnosis not present

## 2021-06-09 DIAGNOSIS — R262 Difficulty in walking, not elsewhere classified: Secondary | ICD-10-CM | POA: Diagnosis not present

## 2021-06-09 DIAGNOSIS — Z96641 Presence of right artificial hip joint: Secondary | ICD-10-CM | POA: Diagnosis not present

## 2021-06-09 DIAGNOSIS — R531 Weakness: Secondary | ICD-10-CM | POA: Diagnosis not present

## 2021-06-09 DIAGNOSIS — M25651 Stiffness of right hip, not elsewhere classified: Secondary | ICD-10-CM | POA: Diagnosis not present

## 2021-06-11 DIAGNOSIS — M1611 Unilateral primary osteoarthritis, right hip: Secondary | ICD-10-CM | POA: Diagnosis not present

## 2021-06-11 DIAGNOSIS — Z9889 Other specified postprocedural states: Secondary | ICD-10-CM | POA: Diagnosis not present

## 2021-06-12 DIAGNOSIS — M25651 Stiffness of right hip, not elsewhere classified: Secondary | ICD-10-CM | POA: Diagnosis not present

## 2021-06-12 DIAGNOSIS — R531 Weakness: Secondary | ICD-10-CM | POA: Diagnosis not present

## 2021-06-12 DIAGNOSIS — R262 Difficulty in walking, not elsewhere classified: Secondary | ICD-10-CM | POA: Diagnosis not present

## 2021-06-12 DIAGNOSIS — Z96641 Presence of right artificial hip joint: Secondary | ICD-10-CM | POA: Diagnosis not present

## 2021-07-01 DIAGNOSIS — H2511 Age-related nuclear cataract, right eye: Secondary | ICD-10-CM | POA: Diagnosis not present

## 2021-07-02 DIAGNOSIS — H2511 Age-related nuclear cataract, right eye: Secondary | ICD-10-CM | POA: Diagnosis not present

## 2021-07-17 DIAGNOSIS — L814 Other melanin hyperpigmentation: Secondary | ICD-10-CM | POA: Diagnosis not present

## 2021-07-17 DIAGNOSIS — D225 Melanocytic nevi of trunk: Secondary | ICD-10-CM | POA: Diagnosis not present

## 2021-07-17 DIAGNOSIS — D485 Neoplasm of uncertain behavior of skin: Secondary | ICD-10-CM | POA: Diagnosis not present

## 2021-07-17 DIAGNOSIS — L821 Other seborrheic keratosis: Secondary | ICD-10-CM | POA: Diagnosis not present

## 2021-07-17 DIAGNOSIS — L570 Actinic keratosis: Secondary | ICD-10-CM | POA: Diagnosis not present

## 2021-07-17 DIAGNOSIS — R208 Other disturbances of skin sensation: Secondary | ICD-10-CM | POA: Diagnosis not present

## 2021-07-17 DIAGNOSIS — Z08 Encounter for follow-up examination after completed treatment for malignant neoplasm: Secondary | ICD-10-CM | POA: Diagnosis not present

## 2021-07-17 DIAGNOSIS — L820 Inflamed seborrheic keratosis: Secondary | ICD-10-CM | POA: Diagnosis not present

## 2021-07-17 DIAGNOSIS — L538 Other specified erythematous conditions: Secondary | ICD-10-CM | POA: Diagnosis not present

## 2021-07-17 DIAGNOSIS — Z86007 Personal history of in-situ neoplasm of skin: Secondary | ICD-10-CM | POA: Diagnosis not present

## 2021-07-17 DIAGNOSIS — L578 Other skin changes due to chronic exposure to nonionizing radiation: Secondary | ICD-10-CM | POA: Diagnosis not present

## 2021-07-17 DIAGNOSIS — G4733 Obstructive sleep apnea (adult) (pediatric): Secondary | ICD-10-CM | POA: Diagnosis not present

## 2021-07-21 DIAGNOSIS — G252 Other specified forms of tremor: Secondary | ICD-10-CM | POA: Diagnosis not present

## 2021-07-21 DIAGNOSIS — K219 Gastro-esophageal reflux disease without esophagitis: Secondary | ICD-10-CM | POA: Diagnosis not present

## 2021-07-21 DIAGNOSIS — E1169 Type 2 diabetes mellitus with other specified complication: Secondary | ICD-10-CM | POA: Diagnosis not present

## 2021-07-21 DIAGNOSIS — I10 Essential (primary) hypertension: Secondary | ICD-10-CM | POA: Diagnosis not present

## 2021-07-21 DIAGNOSIS — M1990 Unspecified osteoarthritis, unspecified site: Secondary | ICD-10-CM | POA: Diagnosis not present

## 2021-07-21 DIAGNOSIS — E782 Mixed hyperlipidemia: Secondary | ICD-10-CM | POA: Diagnosis not present

## 2021-08-05 DIAGNOSIS — E1169 Type 2 diabetes mellitus with other specified complication: Secondary | ICD-10-CM | POA: Diagnosis not present

## 2021-08-05 DIAGNOSIS — E782 Mixed hyperlipidemia: Secondary | ICD-10-CM | POA: Diagnosis not present

## 2021-08-17 DIAGNOSIS — G4733 Obstructive sleep apnea (adult) (pediatric): Secondary | ICD-10-CM | POA: Diagnosis not present

## 2021-09-12 DIAGNOSIS — M62838 Other muscle spasm: Secondary | ICD-10-CM | POA: Diagnosis not present

## 2021-09-12 DIAGNOSIS — E785 Hyperlipidemia, unspecified: Secondary | ICD-10-CM | POA: Diagnosis not present

## 2021-09-12 DIAGNOSIS — E119 Type 2 diabetes mellitus without complications: Secondary | ICD-10-CM | POA: Diagnosis not present

## 2021-09-12 DIAGNOSIS — Z7985 Long-term (current) use of injectable non-insulin antidiabetic drugs: Secondary | ICD-10-CM | POA: Diagnosis not present

## 2021-09-12 DIAGNOSIS — K219 Gastro-esophageal reflux disease without esophagitis: Secondary | ICD-10-CM | POA: Diagnosis not present

## 2021-09-12 DIAGNOSIS — G250 Essential tremor: Secondary | ICD-10-CM | POA: Diagnosis not present

## 2021-09-12 DIAGNOSIS — I10 Essential (primary) hypertension: Secondary | ICD-10-CM | POA: Diagnosis not present

## 2021-09-12 DIAGNOSIS — G4733 Obstructive sleep apnea (adult) (pediatric): Secondary | ICD-10-CM | POA: Diagnosis not present

## 2021-09-12 DIAGNOSIS — Z6835 Body mass index (BMI) 35.0-35.9, adult: Secondary | ICD-10-CM | POA: Diagnosis not present

## 2021-09-12 DIAGNOSIS — Z809 Family history of malignant neoplasm, unspecified: Secondary | ICD-10-CM | POA: Diagnosis not present

## 2021-09-12 DIAGNOSIS — Z7982 Long term (current) use of aspirin: Secondary | ICD-10-CM | POA: Diagnosis not present

## 2021-09-16 DIAGNOSIS — G4733 Obstructive sleep apnea (adult) (pediatric): Secondary | ICD-10-CM | POA: Diagnosis not present

## 2021-10-07 DIAGNOSIS — D492 Neoplasm of unspecified behavior of bone, soft tissue, and skin: Secondary | ICD-10-CM | POA: Diagnosis not present

## 2021-10-07 DIAGNOSIS — C44729 Squamous cell carcinoma of skin of left lower limb, including hip: Secondary | ICD-10-CM | POA: Diagnosis not present

## 2021-10-07 DIAGNOSIS — L578 Other skin changes due to chronic exposure to nonionizing radiation: Secondary | ICD-10-CM | POA: Diagnosis not present

## 2021-10-22 DIAGNOSIS — H18613 Keratoconus, stable, bilateral: Secondary | ICD-10-CM | POA: Diagnosis not present

## 2021-10-22 DIAGNOSIS — H18413 Arcus senilis, bilateral: Secondary | ICD-10-CM | POA: Diagnosis not present

## 2021-10-22 DIAGNOSIS — E119 Type 2 diabetes mellitus without complications: Secondary | ICD-10-CM | POA: Diagnosis not present

## 2021-10-22 DIAGNOSIS — H26492 Other secondary cataract, left eye: Secondary | ICD-10-CM | POA: Diagnosis not present

## 2021-10-29 DIAGNOSIS — C44729 Squamous cell carcinoma of skin of left lower limb, including hip: Secondary | ICD-10-CM | POA: Diagnosis not present

## 2021-10-29 DIAGNOSIS — L905 Scar conditions and fibrosis of skin: Secondary | ICD-10-CM | POA: Diagnosis not present

## 2021-10-29 DIAGNOSIS — D492 Neoplasm of unspecified behavior of bone, soft tissue, and skin: Secondary | ICD-10-CM | POA: Diagnosis not present

## 2021-10-29 DIAGNOSIS — L570 Actinic keratosis: Secondary | ICD-10-CM | POA: Diagnosis not present

## 2021-11-18 DIAGNOSIS — T8131XA Disruption of external operation (surgical) wound, not elsewhere classified, initial encounter: Secondary | ICD-10-CM | POA: Diagnosis not present

## 2021-12-15 DIAGNOSIS — H26491 Other secondary cataract, right eye: Secondary | ICD-10-CM | POA: Diagnosis not present

## 2022-01-15 DIAGNOSIS — Z86007 Personal history of in-situ neoplasm of skin: Secondary | ICD-10-CM | POA: Diagnosis not present

## 2022-01-15 DIAGNOSIS — L578 Other skin changes due to chronic exposure to nonionizing radiation: Secondary | ICD-10-CM | POA: Diagnosis not present

## 2022-01-15 DIAGNOSIS — Z08 Encounter for follow-up examination after completed treatment for malignant neoplasm: Secondary | ICD-10-CM | POA: Diagnosis not present

## 2022-01-15 DIAGNOSIS — L814 Other melanin hyperpigmentation: Secondary | ICD-10-CM | POA: Diagnosis not present

## 2022-01-15 DIAGNOSIS — D225 Melanocytic nevi of trunk: Secondary | ICD-10-CM | POA: Diagnosis not present

## 2022-01-15 DIAGNOSIS — L821 Other seborrheic keratosis: Secondary | ICD-10-CM | POA: Diagnosis not present

## 2022-01-15 DIAGNOSIS — L570 Actinic keratosis: Secondary | ICD-10-CM | POA: Diagnosis not present

## 2022-01-15 DIAGNOSIS — L820 Inflamed seborrheic keratosis: Secondary | ICD-10-CM | POA: Diagnosis not present

## 2022-02-13 DIAGNOSIS — B351 Tinea unguium: Secondary | ICD-10-CM | POA: Diagnosis not present

## 2022-02-13 DIAGNOSIS — E1165 Type 2 diabetes mellitus with hyperglycemia: Secondary | ICD-10-CM | POA: Diagnosis not present

## 2022-02-13 DIAGNOSIS — Z6836 Body mass index (BMI) 36.0-36.9, adult: Secondary | ICD-10-CM | POA: Diagnosis not present

## 2022-02-13 DIAGNOSIS — E1159 Type 2 diabetes mellitus with other circulatory complications: Secondary | ICD-10-CM | POA: Diagnosis not present

## 2022-02-13 DIAGNOSIS — G4733 Obstructive sleep apnea (adult) (pediatric): Secondary | ICD-10-CM | POA: Diagnosis not present

## 2022-02-13 DIAGNOSIS — N401 Enlarged prostate with lower urinary tract symptoms: Secondary | ICD-10-CM | POA: Diagnosis not present

## 2022-02-13 DIAGNOSIS — I10 Essential (primary) hypertension: Secondary | ICD-10-CM | POA: Diagnosis not present

## 2022-02-13 DIAGNOSIS — E782 Mixed hyperlipidemia: Secondary | ICD-10-CM | POA: Diagnosis not present

## 2022-02-13 DIAGNOSIS — K2100 Gastro-esophageal reflux disease with esophagitis, without bleeding: Secondary | ICD-10-CM | POA: Diagnosis not present

## 2022-02-13 DIAGNOSIS — E1169 Type 2 diabetes mellitus with other specified complication: Secondary | ICD-10-CM | POA: Diagnosis not present

## 2022-02-13 DIAGNOSIS — Z79899 Other long term (current) drug therapy: Secondary | ICD-10-CM | POA: Diagnosis not present

## 2022-03-02 DIAGNOSIS — M25571 Pain in right ankle and joints of right foot: Secondary | ICD-10-CM | POA: Diagnosis not present

## 2022-03-02 DIAGNOSIS — L03031 Cellulitis of right toe: Secondary | ICD-10-CM | POA: Diagnosis not present

## 2022-03-02 DIAGNOSIS — S90211A Contusion of right great toe with damage to nail, initial encounter: Secondary | ICD-10-CM | POA: Diagnosis not present

## 2022-03-03 DIAGNOSIS — E782 Mixed hyperlipidemia: Secondary | ICD-10-CM | POA: Diagnosis not present

## 2022-03-03 DIAGNOSIS — N401 Enlarged prostate with lower urinary tract symptoms: Secondary | ICD-10-CM | POA: Diagnosis not present

## 2022-03-03 DIAGNOSIS — E119 Type 2 diabetes mellitus without complications: Secondary | ICD-10-CM | POA: Diagnosis not present

## 2022-03-03 DIAGNOSIS — N3943 Post-void dribbling: Secondary | ICD-10-CM | POA: Diagnosis not present

## 2022-03-03 DIAGNOSIS — I10 Essential (primary) hypertension: Secondary | ICD-10-CM | POA: Diagnosis not present

## 2022-03-03 DIAGNOSIS — Z79899 Other long term (current) drug therapy: Secondary | ICD-10-CM | POA: Diagnosis not present

## 2022-03-18 DIAGNOSIS — M25571 Pain in right ankle and joints of right foot: Secondary | ICD-10-CM | POA: Diagnosis not present

## 2022-04-23 DIAGNOSIS — S2232XA Fracture of one rib, left side, initial encounter for closed fracture: Secondary | ICD-10-CM | POA: Diagnosis not present

## 2022-04-23 DIAGNOSIS — R918 Other nonspecific abnormal finding of lung field: Secondary | ICD-10-CM | POA: Diagnosis not present

## 2022-04-23 DIAGNOSIS — R0781 Pleurodynia: Secondary | ICD-10-CM | POA: Diagnosis not present

## 2022-05-07 DIAGNOSIS — S2232XD Fracture of one rib, left side, subsequent encounter for fracture with routine healing: Secondary | ICD-10-CM | POA: Diagnosis not present

## 2022-05-07 DIAGNOSIS — R918 Other nonspecific abnormal finding of lung field: Secondary | ICD-10-CM | POA: Diagnosis not present

## 2022-05-07 DIAGNOSIS — G4733 Obstructive sleep apnea (adult) (pediatric): Secondary | ICD-10-CM | POA: Diagnosis not present

## 2022-05-20 DIAGNOSIS — G4733 Obstructive sleep apnea (adult) (pediatric): Secondary | ICD-10-CM | POA: Diagnosis not present

## 2022-05-20 DIAGNOSIS — G479 Sleep disorder, unspecified: Secondary | ICD-10-CM | POA: Diagnosis not present

## 2022-05-20 DIAGNOSIS — G4710 Hypersomnia, unspecified: Secondary | ICD-10-CM | POA: Diagnosis not present

## 2022-06-04 DIAGNOSIS — E119 Type 2 diabetes mellitus without complications: Secondary | ICD-10-CM | POA: Diagnosis not present

## 2022-06-04 DIAGNOSIS — M1712 Unilateral primary osteoarthritis, left knee: Secondary | ICD-10-CM | POA: Diagnosis not present

## 2022-06-04 DIAGNOSIS — M25562 Pain in left knee: Secondary | ICD-10-CM | POA: Diagnosis not present

## 2022-07-20 DIAGNOSIS — Z872 Personal history of diseases of the skin and subcutaneous tissue: Secondary | ICD-10-CM | POA: Diagnosis not present

## 2022-07-20 DIAGNOSIS — D225 Melanocytic nevi of trunk: Secondary | ICD-10-CM | POA: Diagnosis not present

## 2022-07-20 DIAGNOSIS — L820 Inflamed seborrheic keratosis: Secondary | ICD-10-CM | POA: Diagnosis not present

## 2022-07-20 DIAGNOSIS — S80811A Abrasion, right lower leg, initial encounter: Secondary | ICD-10-CM | POA: Diagnosis not present

## 2022-07-20 DIAGNOSIS — L578 Other skin changes due to chronic exposure to nonionizing radiation: Secondary | ICD-10-CM | POA: Diagnosis not present

## 2022-07-20 DIAGNOSIS — Z789 Other specified health status: Secondary | ICD-10-CM | POA: Diagnosis not present

## 2022-07-20 DIAGNOSIS — R208 Other disturbances of skin sensation: Secondary | ICD-10-CM | POA: Diagnosis not present

## 2022-07-20 DIAGNOSIS — L298 Other pruritus: Secondary | ICD-10-CM | POA: Diagnosis not present

## 2022-07-20 DIAGNOSIS — Z85828 Personal history of other malignant neoplasm of skin: Secondary | ICD-10-CM | POA: Diagnosis not present

## 2022-07-20 DIAGNOSIS — L821 Other seborrheic keratosis: Secondary | ICD-10-CM | POA: Diagnosis not present

## 2022-07-20 DIAGNOSIS — Z86007 Personal history of in-situ neoplasm of skin: Secondary | ICD-10-CM | POA: Diagnosis not present

## 2022-07-20 DIAGNOSIS — L814 Other melanin hyperpigmentation: Secondary | ICD-10-CM | POA: Diagnosis not present

## 2022-07-20 DIAGNOSIS — Z08 Encounter for follow-up examination after completed treatment for malignant neoplasm: Secondary | ICD-10-CM | POA: Diagnosis not present

## 2022-07-20 DIAGNOSIS — L538 Other specified erythematous conditions: Secondary | ICD-10-CM | POA: Diagnosis not present

## 2022-07-20 DIAGNOSIS — L570 Actinic keratosis: Secondary | ICD-10-CM | POA: Diagnosis not present

## 2022-07-27 DIAGNOSIS — G4710 Hypersomnia, unspecified: Secondary | ICD-10-CM | POA: Diagnosis not present

## 2022-07-27 DIAGNOSIS — G4733 Obstructive sleep apnea (adult) (pediatric): Secondary | ICD-10-CM | POA: Diagnosis not present

## 2022-07-27 DIAGNOSIS — R0683 Snoring: Secondary | ICD-10-CM | POA: Diagnosis not present

## 2022-07-27 DIAGNOSIS — G479 Sleep disorder, unspecified: Secondary | ICD-10-CM | POA: Diagnosis not present

## 2022-08-06 DIAGNOSIS — G4733 Obstructive sleep apnea (adult) (pediatric): Secondary | ICD-10-CM | POA: Diagnosis not present

## 2022-08-06 DIAGNOSIS — Z809 Family history of malignant neoplasm, unspecified: Secondary | ICD-10-CM | POA: Diagnosis not present

## 2022-08-06 DIAGNOSIS — Z87891 Personal history of nicotine dependence: Secondary | ICD-10-CM | POA: Diagnosis not present

## 2022-08-06 DIAGNOSIS — N1831 Chronic kidney disease, stage 3a: Secondary | ICD-10-CM | POA: Diagnosis not present

## 2022-08-06 DIAGNOSIS — I129 Hypertensive chronic kidney disease with stage 1 through stage 4 chronic kidney disease, or unspecified chronic kidney disease: Secondary | ICD-10-CM | POA: Diagnosis not present

## 2022-08-06 DIAGNOSIS — Z85828 Personal history of other malignant neoplasm of skin: Secondary | ICD-10-CM | POA: Diagnosis not present

## 2022-08-06 DIAGNOSIS — E1136 Type 2 diabetes mellitus with diabetic cataract: Secondary | ICD-10-CM | POA: Diagnosis not present

## 2022-08-06 DIAGNOSIS — Z7984 Long term (current) use of oral hypoglycemic drugs: Secondary | ICD-10-CM | POA: Diagnosis not present

## 2022-08-06 DIAGNOSIS — E1122 Type 2 diabetes mellitus with diabetic chronic kidney disease: Secondary | ICD-10-CM | POA: Diagnosis not present

## 2022-08-06 DIAGNOSIS — E785 Hyperlipidemia, unspecified: Secondary | ICD-10-CM | POA: Diagnosis not present

## 2022-08-06 DIAGNOSIS — K219 Gastro-esophageal reflux disease without esophagitis: Secondary | ICD-10-CM | POA: Diagnosis not present

## 2022-08-06 DIAGNOSIS — Z96649 Presence of unspecified artificial hip joint: Secondary | ICD-10-CM | POA: Diagnosis not present

## 2022-08-13 DIAGNOSIS — G4733 Obstructive sleep apnea (adult) (pediatric): Secondary | ICD-10-CM | POA: Diagnosis not present

## 2022-08-24 DIAGNOSIS — M25551 Pain in right hip: Secondary | ICD-10-CM | POA: Diagnosis not present

## 2022-08-24 DIAGNOSIS — M7071 Other bursitis of hip, right hip: Secondary | ICD-10-CM | POA: Diagnosis not present

## 2022-09-11 DIAGNOSIS — G4733 Obstructive sleep apnea (adult) (pediatric): Secondary | ICD-10-CM | POA: Diagnosis not present

## 2022-09-11 DIAGNOSIS — Z0000 Encounter for general adult medical examination without abnormal findings: Secondary | ICD-10-CM | POA: Diagnosis not present

## 2022-09-11 DIAGNOSIS — R0683 Snoring: Secondary | ICD-10-CM | POA: Diagnosis not present

## 2022-09-11 DIAGNOSIS — G4710 Hypersomnia, unspecified: Secondary | ICD-10-CM | POA: Diagnosis not present

## 2022-09-11 DIAGNOSIS — E119 Type 2 diabetes mellitus without complications: Secondary | ICD-10-CM | POA: Diagnosis not present

## 2022-09-11 DIAGNOSIS — G479 Sleep disorder, unspecified: Secondary | ICD-10-CM | POA: Diagnosis not present

## 2022-09-11 DIAGNOSIS — Z79899 Other long term (current) drug therapy: Secondary | ICD-10-CM | POA: Diagnosis not present

## 2022-09-13 DIAGNOSIS — G4733 Obstructive sleep apnea (adult) (pediatric): Secondary | ICD-10-CM | POA: Diagnosis not present

## 2022-09-14 DIAGNOSIS — E1165 Type 2 diabetes mellitus with hyperglycemia: Secondary | ICD-10-CM | POA: Diagnosis not present

## 2022-09-14 DIAGNOSIS — E785 Hyperlipidemia, unspecified: Secondary | ICD-10-CM | POA: Diagnosis not present

## 2022-09-14 DIAGNOSIS — E1169 Type 2 diabetes mellitus with other specified complication: Secondary | ICD-10-CM | POA: Diagnosis not present

## 2022-09-14 DIAGNOSIS — Z23 Encounter for immunization: Secondary | ICD-10-CM | POA: Diagnosis not present

## 2022-09-14 DIAGNOSIS — Z1331 Encounter for screening for depression: Secondary | ICD-10-CM | POA: Diagnosis not present

## 2022-09-14 DIAGNOSIS — Z6836 Body mass index (BMI) 36.0-36.9, adult: Secondary | ICD-10-CM | POA: Diagnosis not present

## 2022-09-14 DIAGNOSIS — I152 Hypertension secondary to endocrine disorders: Secondary | ICD-10-CM | POA: Diagnosis not present

## 2022-09-14 DIAGNOSIS — Z1330 Encounter for screening examination for mental health and behavioral disorders, unspecified: Secondary | ICD-10-CM | POA: Diagnosis not present

## 2022-09-14 DIAGNOSIS — Z79899 Other long term (current) drug therapy: Secondary | ICD-10-CM | POA: Diagnosis not present

## 2022-09-14 DIAGNOSIS — I10 Essential (primary) hypertension: Secondary | ICD-10-CM | POA: Diagnosis not present

## 2022-09-14 DIAGNOSIS — Z0000 Encounter for general adult medical examination without abnormal findings: Secondary | ICD-10-CM | POA: Diagnosis not present

## 2022-09-16 DIAGNOSIS — H02831 Dermatochalasis of right upper eyelid: Secondary | ICD-10-CM | POA: Diagnosis not present

## 2022-09-16 DIAGNOSIS — H02834 Dermatochalasis of left upper eyelid: Secondary | ICD-10-CM | POA: Diagnosis not present

## 2022-10-13 DIAGNOSIS — G4733 Obstructive sleep apnea (adult) (pediatric): Secondary | ICD-10-CM | POA: Diagnosis not present

## 2022-10-14 DIAGNOSIS — S51011A Laceration without foreign body of right elbow, initial encounter: Secondary | ICD-10-CM | POA: Diagnosis not present

## 2022-10-27 DIAGNOSIS — Z4802 Encounter for removal of sutures: Secondary | ICD-10-CM | POA: Diagnosis not present

## 2022-11-13 DIAGNOSIS — L0390 Cellulitis, unspecified: Secondary | ICD-10-CM | POA: Diagnosis not present

## 2022-11-13 DIAGNOSIS — R609 Edema, unspecified: Secondary | ICD-10-CM | POA: Diagnosis not present

## 2022-11-13 DIAGNOSIS — S52024A Nondisplaced fracture of olecranon process without intraarticular extension of right ulna, initial encounter for closed fracture: Secondary | ICD-10-CM | POA: Diagnosis not present

## 2022-11-13 DIAGNOSIS — G4733 Obstructive sleep apnea (adult) (pediatric): Secondary | ICD-10-CM | POA: Diagnosis not present

## 2022-11-20 DIAGNOSIS — S42401A Unspecified fracture of lower end of right humerus, initial encounter for closed fracture: Secondary | ICD-10-CM | POA: Diagnosis not present

## 2022-11-20 DIAGNOSIS — S51001D Unspecified open wound of right elbow, subsequent encounter: Secondary | ICD-10-CM | POA: Diagnosis not present

## 2022-11-25 DIAGNOSIS — M7021 Olecranon bursitis, right elbow: Secondary | ICD-10-CM | POA: Diagnosis not present

## 2022-12-04 DIAGNOSIS — G4733 Obstructive sleep apnea (adult) (pediatric): Secondary | ICD-10-CM | POA: Diagnosis not present

## 2022-12-14 DIAGNOSIS — G4733 Obstructive sleep apnea (adult) (pediatric): Secondary | ICD-10-CM | POA: Diagnosis not present

## 2022-12-22 DIAGNOSIS — Z7985 Long-term (current) use of injectable non-insulin antidiabetic drugs: Secondary | ICD-10-CM | POA: Diagnosis not present

## 2022-12-22 DIAGNOSIS — Z7982 Long term (current) use of aspirin: Secondary | ICD-10-CM | POA: Diagnosis not present

## 2022-12-22 DIAGNOSIS — Z7984 Long term (current) use of oral hypoglycemic drugs: Secondary | ICD-10-CM | POA: Diagnosis not present

## 2022-12-22 DIAGNOSIS — Z79899 Other long term (current) drug therapy: Secondary | ICD-10-CM | POA: Diagnosis not present

## 2022-12-22 DIAGNOSIS — H57811 Brow ptosis, right: Secondary | ICD-10-CM | POA: Diagnosis not present

## 2022-12-22 DIAGNOSIS — H02831 Dermatochalasis of right upper eyelid: Secondary | ICD-10-CM | POA: Diagnosis not present

## 2022-12-22 DIAGNOSIS — I10 Essential (primary) hypertension: Secondary | ICD-10-CM | POA: Diagnosis not present

## 2022-12-22 DIAGNOSIS — H02834 Dermatochalasis of left upper eyelid: Secondary | ICD-10-CM | POA: Diagnosis not present

## 2022-12-22 DIAGNOSIS — E1136 Type 2 diabetes mellitus with diabetic cataract: Secondary | ICD-10-CM | POA: Diagnosis not present

## 2022-12-22 DIAGNOSIS — Z87891 Personal history of nicotine dependence: Secondary | ICD-10-CM | POA: Diagnosis not present

## 2022-12-22 DIAGNOSIS — G4730 Sleep apnea, unspecified: Secondary | ICD-10-CM | POA: Diagnosis not present

## 2023-01-13 DIAGNOSIS — G4733 Obstructive sleep apnea (adult) (pediatric): Secondary | ICD-10-CM | POA: Diagnosis not present

## 2023-01-18 DIAGNOSIS — D225 Melanocytic nevi of trunk: Secondary | ICD-10-CM | POA: Diagnosis not present

## 2023-01-18 DIAGNOSIS — D492 Neoplasm of unspecified behavior of bone, soft tissue, and skin: Secondary | ICD-10-CM | POA: Diagnosis not present

## 2023-01-18 DIAGNOSIS — L821 Other seborrheic keratosis: Secondary | ICD-10-CM | POA: Diagnosis not present

## 2023-01-18 DIAGNOSIS — L814 Other melanin hyperpigmentation: Secondary | ICD-10-CM | POA: Diagnosis not present

## 2023-01-18 DIAGNOSIS — L570 Actinic keratosis: Secondary | ICD-10-CM | POA: Diagnosis not present

## 2023-01-18 DIAGNOSIS — Z86007 Personal history of in-situ neoplasm of skin: Secondary | ICD-10-CM | POA: Diagnosis not present

## 2023-01-18 DIAGNOSIS — L578 Other skin changes due to chronic exposure to nonionizing radiation: Secondary | ICD-10-CM | POA: Diagnosis not present

## 2023-01-18 DIAGNOSIS — Z85828 Personal history of other malignant neoplasm of skin: Secondary | ICD-10-CM | POA: Diagnosis not present

## 2023-01-18 DIAGNOSIS — L559 Sunburn, unspecified: Secondary | ICD-10-CM | POA: Diagnosis not present

## 2023-01-18 DIAGNOSIS — Z08 Encounter for follow-up examination after completed treatment for malignant neoplasm: Secondary | ICD-10-CM | POA: Diagnosis not present

## 2023-01-18 DIAGNOSIS — Z872 Personal history of diseases of the skin and subcutaneous tissue: Secondary | ICD-10-CM | POA: Diagnosis not present

## 2023-02-13 DIAGNOSIS — G4733 Obstructive sleep apnea (adult) (pediatric): Secondary | ICD-10-CM | POA: Diagnosis not present

## 2023-02-16 DIAGNOSIS — G4733 Obstructive sleep apnea (adult) (pediatric): Secondary | ICD-10-CM | POA: Diagnosis not present

## 2023-03-02 DIAGNOSIS — R238 Other skin changes: Secondary | ICD-10-CM | POA: Diagnosis not present

## 2023-03-02 DIAGNOSIS — L570 Actinic keratosis: Secondary | ICD-10-CM | POA: Diagnosis not present

## 2023-03-02 DIAGNOSIS — L538 Other specified erythematous conditions: Secondary | ICD-10-CM | POA: Diagnosis not present

## 2023-03-02 DIAGNOSIS — L2989 Other pruritus: Secondary | ICD-10-CM | POA: Diagnosis not present

## 2023-03-02 DIAGNOSIS — L578 Other skin changes due to chronic exposure to nonionizing radiation: Secondary | ICD-10-CM | POA: Diagnosis not present

## 2023-03-02 DIAGNOSIS — Z789 Other specified health status: Secondary | ICD-10-CM | POA: Diagnosis not present

## 2023-03-02 DIAGNOSIS — B078 Other viral warts: Secondary | ICD-10-CM | POA: Diagnosis not present

## 2023-03-15 DIAGNOSIS — G4733 Obstructive sleep apnea (adult) (pediatric): Secondary | ICD-10-CM | POA: Diagnosis not present

## 2023-04-14 ENCOUNTER — Other Ambulatory Visit (HOSPITAL_COMMUNITY): Payer: Self-pay

## 2023-04-15 ENCOUNTER — Other Ambulatory Visit (HOSPITAL_COMMUNITY): Payer: Self-pay

## 2023-04-15 MED ORDER — MOUNJARO 7.5 MG/0.5ML ~~LOC~~ SOAJ
7.5000 mg | SUBCUTANEOUS | 0 refills | Status: AC
  Filled 2023-04-15: qty 2, 28d supply, fill #0

## 2023-04-26 ENCOUNTER — Other Ambulatory Visit (HOSPITAL_COMMUNITY): Payer: Self-pay

## 2023-04-26 MED ORDER — PROPRANOLOL HCL 40 MG PO TABS
40.0000 mg | ORAL_TABLET | Freq: Two times a day (BID) | ORAL | 3 refills | Status: AC
  Filled 2023-04-26: qty 180, 90d supply, fill #0

## 2023-04-26 MED ORDER — GLIMEPIRIDE 1 MG PO TABS: 1.0000 mg | ORAL_TABLET | Freq: Every morning | ORAL | 3 refills | Status: AC

## 2023-04-26 MED ORDER — CYCLOBENZAPRINE HCL 10 MG PO TABS
10.0000 mg | ORAL_TABLET | Freq: Three times a day (TID) | ORAL | 5 refills | Status: AC
  Filled 2023-04-26: qty 90, 30d supply, fill #0

## 2023-04-26 MED ORDER — OMEPRAZOLE 40 MG PO CPDR
40.0000 mg | DELAYED_RELEASE_CAPSULE | Freq: Every day | ORAL | 3 refills | Status: AC
  Filled 2023-04-26: qty 90, 90d supply, fill #0

## 2023-04-26 MED ORDER — FENOFIBRATE 160 MG PO TABS
160.0000 mg | ORAL_TABLET | Freq: Every day | ORAL | 3 refills | Status: AC
  Filled 2023-04-26: qty 90, 90d supply, fill #0

## 2023-04-26 MED ORDER — IRBESARTAN-HYDROCHLOROTHIAZIDE 150-12.5 MG PO TABS
1.0000 | ORAL_TABLET | Freq: Every day | ORAL | 3 refills | Status: AC
  Filled 2023-04-26: qty 90, 90d supply, fill #0

## 2023-04-27 ENCOUNTER — Other Ambulatory Visit (HOSPITAL_COMMUNITY): Payer: Self-pay

## 2023-04-27 ENCOUNTER — Other Ambulatory Visit: Payer: Self-pay

## 2023-05-07 ENCOUNTER — Other Ambulatory Visit (HOSPITAL_COMMUNITY): Payer: Self-pay

## 2023-05-07 MED ORDER — MOUNJARO 10 MG/0.5ML ~~LOC~~ SOAJ
10.0000 mg | SUBCUTANEOUS | 0 refills | Status: AC
  Filled 2023-05-07: qty 2, 28d supply, fill #0

## 2023-05-10 ENCOUNTER — Other Ambulatory Visit (HOSPITAL_COMMUNITY): Payer: Self-pay

## 2023-05-11 ENCOUNTER — Other Ambulatory Visit: Payer: Self-pay
